# Patient Record
Sex: Male | Born: 1940 | Race: White | Hispanic: No | Marital: Married | Smoking: Former smoker
Health system: Southern US, Community
[De-identification: ages and names within clinical notes are randomized; demographics above are authoritative.]

## PROBLEM LIST (undated history)

## (undated) DIAGNOSIS — M1990 Unspecified osteoarthritis, unspecified site: Secondary | ICD-10-CM

## (undated) DIAGNOSIS — M545 Low back pain, unspecified: Secondary | ICD-10-CM

## (undated) DIAGNOSIS — I10 Essential (primary) hypertension: Secondary | ICD-10-CM

## (undated) DIAGNOSIS — K219 Gastro-esophageal reflux disease without esophagitis: Secondary | ICD-10-CM

## (undated) HISTORY — DX: Gastro-esophageal reflux disease without esophagitis: K21.9

## (undated) HISTORY — PX: HEMORRHOID SURGERY: SHX153

## (undated) HISTORY — DX: Unspecified osteoarthritis, unspecified site: M19.90

## (undated) HISTORY — DX: Low back pain, unspecified: M54.50

## (undated) HISTORY — DX: Essential (primary) hypertension: I10

---

## 2019-09-09 DIAGNOSIS — M79641 Pain in right hand: Secondary | ICD-10-CM | POA: Insufficient documentation

## 2020-03-24 ENCOUNTER — Telehealth: Payer: Self-pay | Admitting: Physical Medicine and Rehabilitation

## 2020-03-24 NOTE — Telephone Encounter (Signed)
Patient called.   He is requesting a call back to set up a new patient appointment for his back. Referred by a friend. [employer] and [employer].   Call back: [phone] or [phone]

## 2020-03-24 NOTE — Telephone Encounter (Signed)
Scheduled for new patient OV on 10/6 at 1000.

## 2020-04-14 ENCOUNTER — Encounter: Payer: Self-pay | Admitting: Physical Medicine and Rehabilitation

## 2020-04-14 ENCOUNTER — Ambulatory Visit: Payer: Medicare PPO | Admitting: Physical Medicine and Rehabilitation

## 2020-04-14 ENCOUNTER — Other Ambulatory Visit: Payer: Self-pay

## 2020-04-14 ENCOUNTER — Ambulatory Visit (INDEPENDENT_AMBULATORY_CARE_PROVIDER_SITE_OTHER): Payer: Medicare PPO

## 2020-04-14 VITALS — BP 145/87 | HR 80

## 2020-04-14 DIAGNOSIS — M545 Low back pain, unspecified: Secondary | ICD-10-CM | POA: Diagnosis not present

## 2020-04-14 DIAGNOSIS — M47816 Spondylosis without myelopathy or radiculopathy, lumbar region: Secondary | ICD-10-CM | POA: Diagnosis not present

## 2020-04-14 DIAGNOSIS — G8929 Other chronic pain: Secondary | ICD-10-CM

## 2020-04-14 NOTE — Progress Notes (Signed)
Pt state lower back pain. Pt state laying down in bed and standing makes the pain worse. Pt state pain meds helps ease the pain.  Numeric Pain Rating Scale and Functional Assessment Average Pain 5 Pain Right Now 2 My pain is constant, dull and aching Pain is worse with: standing Pain improves with: heat/ice and medication   In the last MONTH (on 0-10 scale) has pain interfered with the following?  1. General activity like being  able to carry out your everyday physical activities such as walking, climbing stairs, carrying groceries, or moving a chair?  Rating(5)  2. Relation with others like being able to carry out your usual social activities and roles such as  activities at home, at work and in your community. Rating(5)  3. Enjoyment of life such that you have  been bothered by emotional problems such as feeling anxious, depressed or irritable?  Rating(5)

## 2020-04-22 ENCOUNTER — Other Ambulatory Visit: Payer: Self-pay

## 2020-04-22 ENCOUNTER — Encounter: Payer: Self-pay | Admitting: Physical Medicine and Rehabilitation

## 2020-04-22 ENCOUNTER — Ambulatory Visit: Payer: Medicare PPO | Admitting: Physical Medicine and Rehabilitation

## 2020-04-22 ENCOUNTER — Ambulatory Visit: Payer: Self-pay

## 2020-04-22 VITALS — BP 117/71 | HR 79

## 2020-04-22 DIAGNOSIS — M545 Low back pain, unspecified: Secondary | ICD-10-CM

## 2020-04-22 DIAGNOSIS — G8929 Other chronic pain: Secondary | ICD-10-CM | POA: Diagnosis not present

## 2020-04-22 DIAGNOSIS — M47816 Spondylosis without myelopathy or radiculopathy, lumbar region: Secondary | ICD-10-CM | POA: Diagnosis not present

## 2020-04-22 MED ORDER — BETAMETHASONE SOD PHOS & ACET 6 (3-3) MG/ML IJ SUSP: 12.0000 mg | Freq: Once | INTRAMUSCULAR | Status: DC

## 2020-04-22 MED ORDER — BUPIVACAINE HCL 0.5 % IJ SOLN: 3.0000 mL | Freq: Once | INTRAMUSCULAR | Status: DC

## 2020-04-22 NOTE — Progress Notes (Signed)
Pt state lower back pain. Pt state standing and sitting for a long time makes his pain worse. Pt state he takes pain meds to ease the pain.  Numeric Pain Rating Scale and Functional Assessment Average Pain 4   In the last MONTH (on 0-10 scale) has pain interfered with the following?  1. General activity like being  able to carry out your everyday physical activities such as walking, climbing stairs, carrying groceries, or moving a chair?  Rating(4)   +Driver, -BT, -Dye Allergies.

## 2020-04-23 NOTE — Procedures (Signed)
Lumbar Diagnostic Facet Joint Nerve Block with Fluoroscopic Guidance   Patient: Arthur Moreno      Date of Birth: [birth year] MRN: [id number] [facility]: [facility]      Visit Date: 04/22/2020   Universal Protocol:    Date/Time: 10/15/215:50 AM  Consent Given By: the patient  Position: PRONE  Additional Comments: Vital signs were monitored before and after the procedure. Patient was prepped and draped in the usual sterile fashion. The correct patient, procedure, and site was verified.   Injection Procedure Details:   Procedure diagnoses:  1. Spondylosis without myelopathy or radiculopathy, lumbar region   2. Chronic bilateral low back pain without sciatica      Meds Administered:  Meds ordered this encounter  Medications  . bupivacaine (MARCAINE) 0.5 % (with pres) injection 3 mL  . betamethasone acetate-betamethasone sodium phosphate (CELESTONE) injection 12 mg     Laterality: Bilateral  Location/Site:  L3-L4 L4-L5 L5-S1  Needle size: 22 ga.  Needle type:spinal  Needle Placement: Oblique pedical  Findings:   -Comments: There was excellent flow of contrast along the articular pillars without intravascular flow.  Procedure Details: The fluoroscope beam is vertically oriented in AP and then obliqued 15 to 20 degrees to the ipsilateral side of the desired nerve to achieve the "Scotty dog" appearance.  The skin over the target area of the junction of the superior articulating process and the transverse process (sacral ala if blocking the L5 dorsal rami) was locally anesthetized with a 1 ml volume of 1% Lidocaine without Epinephrine.  The spinal needle was inserted and advanced in a trajectory view down to the target.   After contact with periosteum and negative aspirate for blood and CSF, correct placement without intravascular or epidural spread was confirmed by injecting 0.5 ml. of Isovue-250.  A spot radiograph was obtained of this image.    Next, a 0.5 ml.  volume of the injectate described above was injected. The needle was then redirected to the other facet joint nerves mentioned above if needed.  Prior to the procedure, the patient was given a Pain Diary which was completed for baseline measurements.  After the procedure, the patient rated their pain every 30 minutes and will continue rating at this frequency for a total of 5 hours.  The patient has been asked to complete the Diary and return to [place] by mail, fax or hand delivered as soon as possible.   Additional Comments:  The patient tolerated the procedure well Dressing: 2 x 2 sterile gauze and Band-Aid    Post-procedure details: Patient was observed during the procedure. Post-procedure instructions were reviewed.  Patient left the clinic in stable condition.

## 2020-04-23 NOTE — Progress Notes (Signed)
Arthur Moreno - 79 y.o. male MRN [id number]  Date of birth: 1941/01/01  Office Visit Note: Visit Date: 04/22/2020 [facility]CP: [facility] Referred by: [facility]  Subjective: Chief Complaint  Patient presents with  . Lower Back - Pain   HPI:  Arthur Moreno is a 79 y.o. male who comes in today at the request of Dr. Laurence Spates for planned Bilateral L3-L4, L4-L5 and L5-S1 Lumbar facet/medial branch block with fluoroscopic guidance.  The patient has failed conservative care including home exercise, medications, time and activity modification.  This injection will be diagnostic and hopefully therapeutic.  Please see requesting physician notes for further details and justification.  Exam has shown concordant pain with facet joint loading.  ROS Otherwise per HPI.  Assessment & Plan: Visit Diagnoses:  1. Spondylosis without myelopathy or radiculopathy, lumbar region   2. Chronic bilateral low back pain without sciatica     Plan: No additional findings.   Meds & Orders:  Meds ordered this encounter  Medications  . bupivacaine (MARCAINE) 0.5 % (with pres) injection 3 mL  . betamethasone acetate-betamethasone sodium phosphate (CELESTONE) injection 12 mg    Orders Placed This Encounter  Procedures  . Facet Injection  . XR C-ARM NO REPORT    Follow-up: Return for Review Pain Diary.   Procedures: No procedures performed  Lumbar Diagnostic Facet Joint Nerve Block with Fluoroscopic Guidance   Patient: Arthur Moreno      Date of Birth: 12-29-1940 MRN: [id number] [facility]: [facility]      Visit Date: 04/22/2020   Universal Protocol:    Date/Time: 10/15/215:50 AM  Consent Given By: the patient  Position: PRONE  Additional Comments: Vital signs were monitored before and after the procedure. Patient was prepped and draped in the usual sterile fashion. The correct patient, procedure, and site was verified.   Injection Procedure Details:   Procedure  diagnoses:  1. Spondylosis without myelopathy or radiculopathy, lumbar region   2. Chronic bilateral low back pain without sciatica      Meds Administered:  Meds ordered this encounter  Medications  . bupivacaine (MARCAINE) 0.5 % (with pres) injection 3 mL  . betamethasone acetate-betamethasone sodium phosphate (CELESTONE) injection 12 mg     Laterality: Bilateral  Location/Site:  L3-L4 L4-L5 L5-S1  Needle size: 22 ga.  Needle type:spinal  Needle Placement: Oblique pedical  Findings:   -Comments: There was excellent flow of contrast along the articular pillars without intravascular flow.  Procedure Details: The fluoroscope beam is vertically oriented in AP and then obliqued 15 to 20 degrees to the ipsilateral side of the desired nerve to achieve the "Scotty dog" appearance.  The skin over the target area of the junction of the superior articulating process and the transverse process (sacral ala if blocking the L5 dorsal rami) was locally anesthetized with a 1 ml volume of 1% Lidocaine without Epinephrine.  The spinal needle was inserted and advanced in a trajectory view down to the target.   After contact with periosteum and negative aspirate for blood and CSF, correct placement without intravascular or epidural spread was confirmed by injecting 0.5 ml. of Isovue-250.  A spot radiograph was obtained of this image.    Next, a 0.5 ml. volume of the injectate described above was injected. The needle was then redirected to the other facet joint nerves mentioned above if needed.  Prior to the procedure, the patient was given a Pain Diary which was completed for baseline measurements.  After the  procedure, the patient rated their pain every 30 minutes and will continue rating at this frequency for a total of 5 hours.  The patient has been asked to complete the Diary and return to [place] by mail, fax or hand delivered as soon as possible.   Additional Comments:  The patient tolerated the  procedure well Dressing: 2 x 2 sterile gauze and Band-Aid    Post-procedure details: Patient was observed during the procedure. Post-procedure instructions were reviewed.  Patient left the clinic in stable condition.     Clinical History: No specialty comments available.     Objective:  VS:  HT:    WT:   BMI:     BP:117/71  HR:79bpm  TEMP: ( )  RESP:  Physical Exam Constitutional:      General: He is not in acute distress.    Appearance: Normal appearance. He is not ill-appearing.  HENT:     Head: Normocephalic and atraumatic.     Right Ear: External ear normal.     Left Ear: External ear normal.  Eyes:     Extraocular Movements: Extraocular movements intact.  Cardiovascular:     Rate and Rhythm: Normal rate.     Pulses: Normal pulses.  Abdominal:     General: There is no distension.     Palpations: Abdomen is soft.  Musculoskeletal:        General: No tenderness or signs of injury.     Right lower leg: No edema.     Left lower leg: No edema.     Comments: Patient has good distal strength without clonus.Patient somewhat slow to rise from a seated position to full extension.  There is concordant low back pain with facet loading and lumbar spine extension rotation.  There are no definitive trigger points but the patient is somewhat tender across the lower back and PSIS.  There is no pain with hip rotation.   Skin:    Findings: No erythema or rash.  Neurological:     General: No focal deficit present.     Mental Status: He is alert and oriented to person, place, and time.     Sensory: No sensory deficit.     Motor: No weakness or abnormal muscle tone.     Coordination: Coordination normal.  Psychiatric:        Mood and Affect: Mood normal.        Behavior: Behavior normal.      Imaging: XR C-ARM NO REPORT  Result Date: 04/22/2020 Please see Notes tab for imaging impression.

## 2020-04-26 ENCOUNTER — Telehealth: Payer: Self-pay | Admitting: Physical Medicine and Rehabilitation

## 2020-04-26 NOTE — Telephone Encounter (Signed)
Pt called wanting to check if [facility] received the rest of his paperwork on Friday 04/23/20; pt would like a CB with an update  [phone]

## 2020-04-26 NOTE — Telephone Encounter (Signed)
Called pt bc and sch his inj.

## 2020-05-17 ENCOUNTER — Ambulatory Visit: Payer: Medicare PPO | Admitting: Physical Medicine and Rehabilitation

## 2020-05-17 ENCOUNTER — Other Ambulatory Visit: Payer: Self-pay

## 2020-05-17 ENCOUNTER — Ambulatory Visit: Payer: Self-pay

## 2020-05-17 ENCOUNTER — Encounter: Payer: Self-pay | Admitting: Physical Medicine and Rehabilitation

## 2020-05-17 VITALS — BP 125/79 | HR 74

## 2020-05-17 DIAGNOSIS — G8929 Other chronic pain: Secondary | ICD-10-CM

## 2020-05-17 DIAGNOSIS — M47816 Spondylosis without myelopathy or radiculopathy, lumbar region: Secondary | ICD-10-CM

## 2020-05-17 DIAGNOSIS — M545 Low back pain, unspecified: Secondary | ICD-10-CM

## 2020-05-17 MED ORDER — BUPIVACAINE HCL 0.5 % IJ SOLN
3.0000 mL | Freq: Once | INTRAMUSCULAR | Status: AC
  Administered 2020-05-17: 3 mL

## 2020-05-17 NOTE — Progress Notes (Signed)
Arthur Moreno - 79 y.o. male MRN [id number]  Date of birth: 1941/07/01  Office Visit Note: Visit Date: 05/17/2020 [facility]: [facility] Referred by: [facility]  Subjective: Chief Complaint  Patient presents with  . Lower Back - Pain   HPI:  Arthur Moreno is a 79 y.o. male who comes in today for planned repeat Bilateral L3-L4, L4-L5 and L5-S1 Lumbar facet/medial branch block with fluoroscopic guidance.  The patient has failed conservative care including home exercise, medications, time and activity modification.  This injection will be diagnostic and hopefully therapeutic.  Please see requesting physician notes for further details and justification.  Exam shows concordant low back pain with facet joint loading and extension. Patient received more than 80% pain relief from prior injection. This would be the second block in a diagnostic double block paradigm.     Referring:Dr. Laurence Spates    ROS Otherwise per HPI.  Assessment & Plan: Visit Diagnoses:  1. Spondylosis without myelopathy or radiculopathy, lumbar region   2. Chronic bilateral low back pain without sciatica     Plan: No additional findings.   Meds & Orders:  Meds ordered this encounter  Medications  . bupivacaine (MARCAINE) 0.5 % (with pres) injection 3 mL    Orders Placed This Encounter  Procedures  . Facet Injection  . XR C-ARM NO REPORT    Follow-up: Return for Review Pain Diary.   Procedures: No procedures performed  Lumbar Diagnostic Facet Joint Nerve Block with Fluoroscopic Guidance   Patient: Arthur Moreno      Date of Birth: 02/02/1941 MRN: [id number] [facility]: [facility]      Visit Date: 05/17/2020   Universal Protocol:    Date/Time: 11/08/212:10 PM  Consent Given By: the patient  Position: PRONE  Additional Comments: Vital signs were monitored before and after the procedure. Patient was prepped and draped in the usual sterile fashion. The correct patient,  procedure, and site was verified.   Injection Procedure Details:   Procedure diagnoses:  1. Spondylosis without myelopathy or radiculopathy, lumbar region   2. Chronic bilateral low back pain without sciatica      Meds Administered:  Meds ordered this encounter  Medications  . bupivacaine (MARCAINE) 0.5 % (with pres) injection 3 mL     Laterality: Bilateral  Location/Site:  L3-L4 L4-L5 L5-S1  Needle:5.0 in., 25 ga.  Short bevel or Quincke spinal needle  Needle Placement: Oblique pedical  Findings:   -Comments: There was excellent flow of contrast along the articular pillars without intravascular flow.  Procedure Details: The fluoroscope beam is vertically oriented in AP and then obliqued 15 to 20 degrees to the ipsilateral side of the desired nerve to achieve the "Scotty dog" appearance.  The skin over the target area of the junction of the superior articulating process and the transverse process (sacral ala if blocking the L5 dorsal rami) was locally anesthetized with a 1 ml volume of 1% Lidocaine without Epinephrine.  The spinal needle was inserted and advanced in a trajectory view down to the target.   After contact with periosteum and negative aspirate for blood and CSF, correct placement without intravascular or epidural spread was confirmed by injecting 0.5 ml. of Isovue-250.  A spot radiograph was obtained of this image.    Next, a 0.5 ml. volume of the injectate described above was injected. The needle was then redirected to the other facet joint nerves mentioned above if needed.  Prior to the procedure, the patient was given  a Pain Diary which was completed for baseline measurements.  After the procedure, the patient rated their pain every 30 minutes and will continue rating at this frequency for a total of 5 hours.  The patient has been asked to complete the Diary and return to [place] by mail, fax or hand delivered as soon as possible.   Additional Comments:  The  patient tolerated the procedure well Dressing: 2 x 2 sterile gauze and Band-Aid    Post-procedure details: Patient was observed during the procedure. Post-procedure instructions were reviewed.  Patient left the clinic in stable condition.    Clinical History: No specialty comments available.     Objective:  VS:  HT:    WT:   BMI:     BP:125/79  HR:74bpm  TEMP: ( )  RESP:  Physical Exam Constitutional:      General: He is not in acute distress.    Appearance: Normal appearance. He is not ill-appearing.  HENT:     Head: Normocephalic and atraumatic.     Right Ear: External ear normal.     Left Ear: External ear normal.  Eyes:     Extraocular Movements: Extraocular movements intact.  Cardiovascular:     Rate and Rhythm: Normal rate.     Pulses: Normal pulses.  Abdominal:     General: There is no distension.     Palpations: Abdomen is soft.  Musculoskeletal:        General: No tenderness or signs of injury.     Right lower leg: No edema.     Left lower leg: No edema.     Comments: Patient has good distal strength without clonus. Patient somewhat slow to rise from a seated position to full extension.  There is concordant low back pain with facet loading and lumbar spine extension rotation.  There are no definitive trigger points but the patient is somewhat tender across the lower back and PSIS.  There is no pain with hip rotation.   Skin:    Findings: No erythema or rash.  Neurological:     General: No focal deficit present.     Mental Status: He is alert and oriented to person, place, and time.     Sensory: No sensory deficit.     Motor: No weakness or abnormal muscle tone.     Coordination: Coordination normal.  Psychiatric:        Mood and Affect: Mood normal.        Behavior: Behavior normal.      Imaging: XR C-ARM NO REPORT  Result Date: 05/17/2020 Please see Notes tab for imaging impression.

## 2020-05-17 NOTE — Procedures (Signed)
Lumbar Diagnostic Facet Joint Nerve Block with Fluoroscopic Guidance   Patient: Arthur Moreno      Date of Birth: [birth year] MRN: [id number] [facility]CP: [facility]      Visit Date: 05/17/2020   Universal Protocol:    Date/Time: 11/08/212:10 PM  Consent Given By: the patient  Position: PRONE  Additional Comments: Vital signs were monitored before and after the procedure. Patient was prepped and draped in the usual sterile fashion. The correct patient, procedure, and site was verified.   Injection Procedure Details:   Procedure diagnoses:  1. Spondylosis without myelopathy or radiculopathy, lumbar region   2. Chronic bilateral low back pain without sciatica      Meds Administered:  Meds ordered this encounter  Medications  . bupivacaine (MARCAINE) 0.5 % (with pres) injection 3 mL     Laterality: Bilateral  Location/Site:  L3-L4 L4-L5 L5-S1  Needle:5.0 in., 25 ga.  Short bevel or Quincke spinal needle  Needle Placement: Oblique pedical  Findings:   -Comments: There was excellent flow of contrast along the articular pillars without intravascular flow.  Procedure Details: The fluoroscope beam is vertically oriented in AP and then obliqued 15 to 20 degrees to the ipsilateral side of the desired nerve to achieve the "Scotty dog" appearance.  The skin over the target area of the junction of the superior articulating process and the transverse process (sacral ala if blocking the L5 dorsal rami) was locally anesthetized with a 1 ml volume of 1% Lidocaine without Epinephrine.  The spinal needle was inserted and advanced in a trajectory view down to the target.   After contact with periosteum and negative aspirate for blood and CSF, correct placement without intravascular or epidural spread was confirmed by injecting 0.5 ml. of Isovue-250.  A spot radiograph was obtained of this image.    Next, a 0.5 ml. volume of the injectate described above was injected. The needle was  then redirected to the other facet joint nerves mentioned above if needed.  Prior to the procedure, the patient was given a Pain Diary which was completed for baseline measurements.  After the procedure, the patient rated their pain every 30 minutes and will continue rating at this frequency for a total of 5 hours.  The patient has been asked to complete the Diary and return to [place] by mail, fax or hand delivered as soon as possible.   Additional Comments:  The patient tolerated the procedure well Dressing: 2 x 2 sterile gauze and Band-Aid    Post-procedure details: Patient was observed during the procedure. Post-procedure instructions were reviewed.  Patient left the clinic in stable condition.

## 2020-05-17 NOTE — Progress Notes (Signed)
Pt state lower back pain. Pt state standing for a long period of time makes the pain worse. Pt state he has to sit down to ease the pain. Pt has hx of inj on 04/22/20 pt state that the inj was great and it helped. Pt state that the pain starts to return after four days.  Numeric Pain Rating Scale and Functional Assessment Average Pain 4   In the last MONTH (on 0-10 scale) has pain interfered with the following?  1. General activity like being  able to carry out your everyday physical activities such as walking, climbing stairs, carrying groceries, or moving a chair?  Rating(5)   +Driver, -BT, -Dye Allergies.

## 2020-05-18 ENCOUNTER — Telehealth: Payer: Self-pay | Admitting: Physical Medicine and Rehabilitation

## 2020-05-18 NOTE — Telephone Encounter (Signed)
Received pain dairy and pt insurance is PENDING.

## 2020-05-18 NOTE — Telephone Encounter (Signed)
Pt called asking for a CB from [facility] in regards to forms that were faxed to her and he would like to set an appt as well  [phone]

## 2020-05-20 NOTE — Telephone Encounter (Signed)
Called pt and sch RFA 06/29/20 and 07/13/20.

## 2020-06-06 ENCOUNTER — Encounter: Payer: Self-pay | Admitting: Physical Medicine and Rehabilitation

## 2020-06-06 NOTE — Progress Notes (Signed)
Arthur Moreno - 79 y.o. male MRN [id number]  Date of birth: 03-08-1941  Office Visit Note: Visit Date: 04/14/2020 PCP: [facility] Referred by: No ref. provider found  Subjective: Chief Complaint  Patient presents with  . Lower Back - Pain   HPI: Arthur Moreno is a 79 y.o. male who comes in today As a self-referral for chronic worsening axial low back pain that is been severe over the last 6 months.  He really reports pain over the last many years and he has had chiropractic care he has had physical therapy and he does complete home exercise still.  Home exercise routine is generally stretching and core strengthening that he learned.  He reports a constant dull and aching pain worse with standing.  He reports no radicular pain into the thighs or buttock.  No neurogenic claudication symptoms.  Medication heat and ice tends to help to some degree but not much.  He does use anti-inflammatories as well as Tylenol and topical medications.  He reports laying down in bed and trying to get up and rotate in bed really is difficult.  He does use ibuprofen mostly for pain relief but he cannot be on that for long periods because of hypertension.  He has not had any opioid medication.  He has had muscle relaxers without any real results.  He has never had any spine surgery or spine procedures.  I did obtain x-rays today of the lumbar spine.  This is reviewed with him today using spine models and imaging.  The dictated report is below.  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  He has no red flag symptoms of focal weakness or radicular pain.  Chronic ongoing recalcitrant back pain.  He reports 5 out of 10 back pain that does limit his daily activities.  He was referred by a friend who has had radiofrequency ablation done by me and did seem to help with her back pain.  Review of Systems  Musculoskeletal: Positive for back pain.  All other systems reviewed and are  negative.  Otherwise per HPI.  Assessment & Plan: Visit Diagnoses:  1. Chronic bilateral low back pain without sciatica   2. Spondylosis without myelopathy or radiculopathy, lumbar region     Plan: Findings:  Chronic worsening severe axial low back pain worse with standing worse with facet loading on exam with no clinical findings of radiculopathy or stenosis or claudication.  Imaging does show facet arthropathy.  Patient has had physical therapy and chiropractic care and continues on home exercises without much relief.  He has been using pain medications mainly Tylenol and ibuprofen with only temporary relief.  Neck step is diagnostic medial branch blocks of the lower lumbar facet joints using fluoroscopic guidance.  We will use a pain diary.  If he gets more than 50% relief would look at double block paradigm and potential for radiofrequency ablation.    Meds & Orders: No orders of the defined types were placed in this encounter.   Orders Placed This Encounter  Procedures  . XR Lumbar Spine 2-3 Views    Follow-up: Return for Bilateral medial branch blocks as noted.   Procedures: No procedures performed  No notes on file   Clinical History: No specialty comments available.   He has no history on file for tobacco use. No results for input(s): HGBA1C, LABURIC in the last 8760 hours.  Objective:  VS:  HT:  WT:   BMI:     BP:(!) 145/87  HR:80bpm  TEMP: ( )  RESP:  Physical Exam Constitutional:      General: He is not in acute distress.    Appearance: Normal appearance. He is not ill-appearing.  HENT:     Head: Normocephalic and atraumatic.     Right Ear: External ear normal.     Left Ear: External ear normal.  Eyes:     Extraocular Movements: Extraocular movements intact.  Cardiovascular:     Rate and Rhythm: Normal rate.     Pulses: Normal pulses.  Pulmonary:     Effort: Pulmonary effort is normal. No respiratory distress.  Abdominal:     General: There is no  distension.     Palpations: Abdomen is soft.  Musculoskeletal:        General: No tenderness or signs of injury.     Right lower leg: No edema.     Left lower leg: No edema.     Comments: Patient has good distal strength without clonus. Patient somewhat slow to rise from a seated position to full extension.  There is concordant low back pain with facet loading and lumbar spine extension rotation.  There are no definitive trigger points but the patient is somewhat tender across the lower back and PSIS.  There is no pain with hip rotation.  Skin:    Findings: No erythema or rash.  Neurological:     General: No focal deficit present.     Mental Status: He is alert and oriented to person, place, and time.     Sensory: No sensory deficit.     Motor: No weakness or abnormal muscle tone.     Coordination: Coordination normal.  Psychiatric:        Mood and Affect: Mood normal.        Behavior: Behavior normal.     Ortho Exam  Imaging: AP and lateral lumbar spine dated today: Patient has fairly normal anatomic alignment of the lumbar spine with good lumbar lordosis with only very minimal listhesis of L2 on L3.  He has facet arthropathy at L5-S1 in particular with by foraminal narrowing.  Some arthropathy at L4-5.  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  No fractures or dislocations.  Past Medical/Family/Surgical/Social History: Medications & Allergies reviewed per EMR, new medications updated. Patient Active Problem List   Diagnosis Date Noted  . Pain in right hand 09/09/2019   Past Medical History:  Diagnosis Date  . Acid reflux   . Hypertension   . Low back pain    No family history on file. Past Surgical History:  Procedure Laterality Date  . HEMORRHOID SURGERY     At the age of 2yrs   Social History   Occupational History  . Not on file  Tobacco Use  . Smoking status: Not on file  Substance and Sexual Activity  . Alcohol use: Not on file   . Drug use: Not on file  . Sexual activity: Not on file

## 2020-06-29 ENCOUNTER — Encounter: Payer: Self-pay | Admitting: Physical Medicine and Rehabilitation

## 2020-06-29 ENCOUNTER — Ambulatory Visit: Payer: Medicare PPO | Admitting: Physical Medicine and Rehabilitation

## 2020-06-29 ENCOUNTER — Ambulatory Visit: Payer: Self-pay

## 2020-06-29 ENCOUNTER — Other Ambulatory Visit: Payer: Self-pay

## 2020-06-29 VITALS — BP 113/77 | HR 80

## 2020-06-29 DIAGNOSIS — M47816 Spondylosis without myelopathy or radiculopathy, lumbar region: Secondary | ICD-10-CM

## 2020-06-29 MED ORDER — METHYLPREDNISOLONE ACETATE 80 MG/ML IJ SUSP
80.0000 mg | Freq: Once | INTRAMUSCULAR | Status: AC
  Administered 2020-06-29: 16:00:00 80 mg

## 2020-06-29 NOTE — Progress Notes (Signed)
Pt state lower back pain. Pt state standing makes the pain worse. Pt state pain meds and sitting down helps ease the pain. Pt has hx of inj on 05/17/20 pt state it worked well but lasted for four days.  Numeric Pain Rating Scale and Functional Assessment Average Pain 5   In the last MONTH (on 0-10 scale) has pain interfered with the following?  1. General activity like being  able to carry out your everyday physical activities such as walking, climbing stairs, carrying groceries, or moving a chair?  Rating(7)   +Driver, -BT, -Dye Allergies.

## 2020-06-30 ENCOUNTER — Encounter: Payer: Self-pay | Admitting: Physical Medicine and Rehabilitation

## 2020-06-30 NOTE — Procedures (Signed)
Lumbar Facet Joint Nerve Denervation  Patient: Arthur Moreno      Date of Birth: 09-Jan-1941 MRN: [id number] [facility]: [facility]      Visit Date: 06/29/2020   Universal Protocol:    Date/Time: 12/22/216:14 AM  Consent Given By: the patient  Position: PRONE  Additional Comments: Vital signs were monitored before and after the procedure. Patient was prepped and draped in the usual sterile fashion. The correct patient, procedure, and site was verified.   Injection Procedure Details:   Procedure diagnoses:  1. Spondylosis without myelopathy or radiculopathy, lumbar region      Meds Administered:  Meds ordered this encounter  Medications  . methylPREDNISolone acetate (DEPO-MEDROL) injection 80 mg     Laterality: Right  Location/Site:  L3-L4, L2 and L3 medial branches, L4-L5, L3 and L4 medial branches and L5-S1, L4 medial branch and L5 dorsal ramus  Needle: 18 ga.,  11mm active tip RF Cannula  Needle Placement: Along juncture of superior articular process and transverse pocess  Findings:  -Comments:  Procedure Details: For each desired target nerve, the corresponding transverse process (sacral ala for the L5 dorsal rami) was identified and the fluoroscope was positioned to square off the endplates of the corresponding vertebral body to achieve a true AP midline view.  The beam was then obliqued 15 to 20 degrees and caudally tilted 15 to 20 degrees to line up a trajectory along the target nerves. The skin over the target of the junction of superior articulating process and transverse process (sacral ala for the L5 dorsal rami) was infiltrated with 25ml of 1% Lidocaine without Epinephrine.  The 18 gauge 69mm active tip outer cannula was advanced in trajectory view to the target.  This procedure was repeated for each target nerve.  Then, for all levels, the outer cannula placement was fine-tuned and the position was then confirmed with bi-planar imaging.    Test  stimulation was done both at sensory and motor levels to ensure there was no radicular stimulation. The target tissues were then infiltrated with 1 ml of 1% Lidocaine without Epinephrine. Subsequently, a percutaneous neurotomy was carried out for 90 seconds at 80 degrees Celsius.  After the completion of the lesion, 1 ml of injectate was delivered. It was then repeated for each facet joint nerve mentioned above. Appropriate radiographs were obtained to verify the probe placement during the neurotomy.   Additional Comments:  The patient tolerated the procedure well Dressing: 2 x 2 sterile gauze and Band-Aid    Post-procedure details: Patient was observed during the procedure. Post-procedure instructions were reviewed.  Patient left the clinic in stable condition.

## 2020-06-30 NOTE — Progress Notes (Signed)
Arthur Moreno - 79 y.o. male MRN [id number]  Date of birth: 05-11-1941  Office Visit Note: Visit Date: 06/29/2020 [facility]: [facility] Referred by: [facility]  Subjective: Chief Complaint  Patient presents with  . Lower Back - Pain   HPI:  [name] is a 79 y.o. male who comes in today for planned radiofrequency ablation of the Right L3-L4, L4-L5 and L5-S1 Lumbar facet joints. This would be ablation of the corresponding medial branches and/or dorsal rami.  Patient has had double diagnostic blocks with more than 50% relief.  These are documented on pain diary.  They have had chronic back pain for quite some time, more than 3 months, which has been an ongoing situation with recalcitrant axial back pain.  They have no radicular pain.  Their axial pain is worse with standing and ambulating and on exam today with facet loading.  They have had physical therapy as well as home exercise program.  The imaging noted in the chart below indicated facet pathology. Accordingly they meet all the criteria and qualification for for radiofrequency ablation and we are going to complete this today hopefully for more longer term relief as part of comprehensive management program.  ROS Otherwise per HPI.  Assessment & Plan: Visit Diagnoses:    ICD-10-CM   1. Spondylosis without myelopathy or radiculopathy, lumbar region  M47.816 XR C-ARM NO REPORT    Radiofrequency,Lumbar    methylPREDNISolone acetate (DEPO-MEDROL) injection 80 mg    Plan: No additional findings.   Meds & Orders:  Meds ordered this encounter  Medications  . methylPREDNISolone acetate (DEPO-MEDROL) injection 80 mg    Orders Placed This Encounter  Procedures  . Radiofrequency,Lumbar  . XR C-ARM NO REPORT    Follow-up: Return if symptoms worsen or fail to improve.   Procedures: No procedures performed  Lumbar Facet Joint Nerve Denervation  Patient: Arthur Moreno      Date of Birth: 06/30/1941 MRN:  [id number] [facility]: [facility]      Visit Date: 06/29/2020   Universal Protocol:    Date/Time: 12/22/216:14 AM  Consent Given By: the patient  Position: PRONE  Additional Comments: Vital signs were monitored before and after the procedure. Patient was prepped and draped in the usual sterile fashion. The correct patient, procedure, and site was verified.   Injection Procedure Details:   Procedure diagnoses:  1. Spondylosis without myelopathy or radiculopathy, lumbar region      Meds Administered:  Meds ordered this encounter  Medications  . methylPREDNISolone acetate (DEPO-MEDROL) injection 80 mg     Laterality: Right  Location/Site:  L3-L4, L2 and L3 medial branches, L4-L5, L3 and L4 medial branches and L5-S1, L4 medial branch and L5 dorsal ramus  Needle: 18 ga.,  25mm active tip RF Cannula  Needle Placement: Along juncture of superior articular process and transverse pocess  Findings:  -Comments:  Procedure Details: For each desired target nerve, the corresponding transverse process (sacral ala for the L5 dorsal rami) was identified and the fluoroscope was positioned to square off the endplates of the corresponding vertebral body to achieve a true AP midline view.  The beam was then obliqued 15 to 20 degrees and caudally tilted 15 to 20 degrees to line up a trajectory along the target nerves. The skin over the target of the junction of superior articulating process and transverse process (sacral ala for the L5 dorsal rami) was infiltrated with 34ml of 1% Lidocaine without Epinephrine.  The 18 gauge 40mm active  tip outer cannula was advanced in trajectory view to the target.  This procedure was repeated for each target nerve.  Then, for all levels, the outer cannula placement was fine-tuned and the position was then confirmed with bi-planar imaging.    Test stimulation was done both at sensory and motor levels to ensure there was no radicular stimulation. The  target tissues were then infiltrated with 1 ml of 1% Lidocaine without Epinephrine. Subsequently, a percutaneous neurotomy was carried out for 90 seconds at 80 degrees Celsius.  After the completion of the lesion, 1 ml of injectate was delivered. It was then repeated for each facet joint nerve mentioned above. Appropriate radiographs were obtained to verify the probe placement during the neurotomy.   Additional Comments:  The patient tolerated the procedure well Dressing: 2 x 2 sterile gauze and Band-Aid    Post-procedure details: Patient was observed during the procedure. Post-procedure instructions were reviewed.  Patient left the clinic in stable condition.       Clinical History: No specialty comments available.     Objective:  VS:  HT:    WT:   BMI:     BP:113/77  HR:80bpm  TEMP: ( )  RESP:  Physical Exam Constitutional:      General: He is not in acute distress.    Appearance: Normal appearance. He is not ill-appearing.  HENT:     Head: Normocephalic and atraumatic.     Right Ear: External ear normal.     Left Ear: External ear normal.  Eyes:     Extraocular Movements: Extraocular movements intact.  Cardiovascular:     Rate and Rhythm: Normal rate.     Pulses: Normal pulses.  Abdominal:     General: There is no distension.     Palpations: Abdomen is soft.  Musculoskeletal:        General: No tenderness or signs of injury.     Right lower leg: No edema.     Left lower leg: No edema.     Comments: Patient has good distal strength without clonus. Patient somewhat slow to rise from a seated position to full extension.  There is concordant low back pain with facet loading and lumbar spine extension rotation.  There are no definitive trigger points but the patient is somewhat tender across the lower back and PSIS.  There is no pain with hip rotation.   Skin:    Findings: No erythema or rash.  Neurological:     General: No focal deficit present.     Mental  Status: He is alert and oriented to person, place, and time.     Sensory: No sensory deficit.     Motor: No weakness or abnormal muscle tone.     Coordination: Coordination normal.  Psychiatric:        Mood and Affect: Mood normal.        Behavior: Behavior normal.      Imaging: XR C-ARM NO REPORT  Result Date: 06/29/2020 Please see Notes tab for imaging impression.

## 2020-07-13 ENCOUNTER — Other Ambulatory Visit: Payer: Self-pay

## 2020-07-13 ENCOUNTER — Encounter: Payer: Self-pay | Admitting: Physical Medicine and Rehabilitation

## 2020-07-13 ENCOUNTER — Ambulatory Visit: Payer: Medicare PPO | Admitting: Physical Medicine and Rehabilitation

## 2020-07-13 ENCOUNTER — Ambulatory Visit: Payer: Self-pay

## 2020-07-13 VITALS — BP 128/78 | HR 84

## 2020-07-13 DIAGNOSIS — G8929 Other chronic pain: Secondary | ICD-10-CM

## 2020-07-13 DIAGNOSIS — M47816 Spondylosis without myelopathy or radiculopathy, lumbar region: Secondary | ICD-10-CM | POA: Diagnosis not present

## 2020-07-13 DIAGNOSIS — M545 Low back pain, unspecified: Secondary | ICD-10-CM | POA: Diagnosis not present

## 2020-07-13 MED ORDER — BETAMETHASONE SOD PHOS & ACET 6 (3-3) MG/ML IJ SUSP
12.0000 mg | Freq: Once | INTRAMUSCULAR | Status: AC
  Administered 2020-07-13: 12 mg

## 2020-07-13 NOTE — Progress Notes (Signed)
Pt state lower back pain , mostly on his right side. Pt states standing for a long period of time makes the pain. Pt state he would sit down and takes pain meds to help ease the pain. Pt has hx of inj on 06/29/20 Pt state it helped.    Numeric Pain Rating Scale and Functional Assessment Average Pain 8   In the last MONTH (on 0-10 scale) has pain interfered with the following?  1. General activity like being  able to carry out your everyday physical activities such as walking, climbing stairs, carrying groceries, or moving a chair?  Rating(8)   +Driver, -BT, -Dye Allergies.

## 2020-07-13 NOTE — Progress Notes (Signed)
Arthur Moreno - 80 y.o. male MRN [id number]  Date of birth: June 29, 1941  Office Visit Note: Visit Date: 07/13/2020 [facility]: [facility] Referred by: [facility]  Subjective: Chief Complaint  Patient presents with  . Lower Back - Pain   HPI:  Arthur Moreno is a 80 y.o. male who comes in today for planned radiofrequency ablation of the Left L3-L4, L4-L5 and L5-S1 Lumbar facet joints. This would be ablation of the corresponding medial branches and/or dorsal rami.  Patient has had double diagnostic blocks with more than 50% relief.  These are documented on pain diary.  They have had chronic back pain for quite some time, more than 3 months, which has been an ongoing situation with recalcitrant axial back pain.  They have no radicular pain.  Their axial pain is worse with standing and ambulating and on exam today with facet loading.  They have had physical therapy as well as home exercise program.  The imaging noted in the chart below indicated facet pathology. Accordingly they meet all the criteria and qualification for for radiofrequency ablation and we are going to complete this today hopefully for more longer term relief as part of comprehensive management program.  ROS Otherwise per HPI.  Assessment & Plan: Visit Diagnoses:    ICD-10-CM   1. Spondylosis without myelopathy or radiculopathy, lumbar region  M47.816 XR C-ARM NO REPORT    Radiofrequency,Lumbar    betamethasone acetate-betamethasone sodium phosphate (CELESTONE) injection 12 mg  2. Chronic bilateral low back pain without sciatica  M54.50 XR C-ARM NO REPORT   G89.29 Radiofrequency,Lumbar    betamethasone acetate-betamethasone sodium phosphate (CELESTONE) injection 12 mg    Plan: No additional findings.   Meds & Orders:  Meds ordered this encounter  Medications  . betamethasone acetate-betamethasone sodium phosphate (CELESTONE) injection 12 mg    Orders Placed This Encounter  Procedures  .  Radiofrequency,Lumbar  . XR C-ARM NO REPORT    Follow-up: Return in about 4 weeks (around 08/10/2020).   Procedures: No procedures performed  Lumbar Facet Joint Nerve Denervation  Patient: Arthur Moreno      Date of Birth: 1941-06-04 MRN: [id number] [facility]: [facility]      Visit Date: 07/13/2020   Universal Protocol:    Date/Time: 01/04/223:47 PM  Consent Given By: the patient  Position: PRONE  Additional Comments: Vital signs were monitored before and after the procedure. Patient was prepped and draped in the usual sterile fashion. The correct patient, procedure, and site was verified.   Injection Procedure Details:   Procedure diagnoses:  1. Spondylosis without myelopathy or radiculopathy, lumbar region   2. Chronic bilateral low back pain without sciatica      Meds Administered:  Meds ordered this encounter  Medications  . betamethasone acetate-betamethasone sodium phosphate (CELESTONE) injection 12 mg     Laterality: Left  Location/Site:  L3-L4, L2 and L3 medial branches, L4-L5, L3 and L4 medial branches and L5-S1, L4 medial branch and L5 dorsal ramus  Needle: 18 ga.,  20mm active tip RF Cannula  Needle Placement: Along juncture of superior articular process and transverse pocess  Findings:  -Comments:  Procedure Details: For each desired target nerve, the corresponding transverse process (sacral ala for the L5 dorsal rami) was identified and the fluoroscope was positioned to square off the endplates of the corresponding vertebral body to achieve a true AP midline view.  The beam was then obliqued 15 to 20 degrees and caudally tilted 15 to 20 degrees to  line up a trajectory along the target nerves. The skin over the target of the junction of superior articulating process and transverse process (sacral ala for the L5 dorsal rami) was infiltrated with 27ml of 1% Lidocaine without Epinephrine.  The 18 gauge 39mm active tip outer cannula was advanced in  trajectory view to the target.  This procedure was repeated for each target nerve.  Then, for all levels, the outer cannula placement was fine-tuned and the position was then confirmed with bi-planar imaging.    Test stimulation was done both at sensory and motor levels to ensure there was no radicular stimulation. The target tissues were then infiltrated with 1 ml of 1% Lidocaine without Epinephrine. Subsequently, a percutaneous neurotomy was carried out for 90 seconds at 80 degrees Celsius.  After the completion of the lesion, 1 ml of injectate was delivered. It was then repeated for each facet joint nerve mentioned above. Appropriate radiographs were obtained to verify the probe placement during the neurotomy.   Additional Comments:  The patient tolerated the procedure well Dressing: 2 x 2 sterile gauze and Band-Aid    Post-procedure details: Patient was observed during the procedure. Post-procedure instructions were reviewed.  Patient left the clinic in stable condition.        Clinical History: No specialty comments available.     Objective:  VS:  HT:    WT:   BMI:     BP:128/78  HR:84bpm  TEMP: ( )  RESP:  Physical Exam Vitals and nursing note reviewed.  Constitutional:      General: He is not in acute distress.    Appearance: Normal appearance. He is not ill-appearing.  HENT:     Head: Normocephalic and atraumatic.     Right Ear: External ear normal.     Left Ear: External ear normal.     Nose: No congestion.  Eyes:     Extraocular Movements: Extraocular movements intact.  Cardiovascular:     Rate and Rhythm: Normal rate.     Pulses: Normal pulses.  Pulmonary:     Effort: Pulmonary effort is normal. No respiratory distress.  Abdominal:     General: There is no distension.     Palpations: Abdomen is soft.  Musculoskeletal:        General: No tenderness or signs of injury.     Cervical back: Neck supple.     Right lower leg: No edema.     Left lower  leg: No edema.     Comments: Patient has good distal strength without clonus. Patient somewhat slow to rise from a seated position to full extension.  There is concordant low back pain with facet loading and lumbar spine extension rotation.  There are no definitive trigger points but the patient is somewhat tender across the lower back and PSIS.  There is no pain with hip rotation.   Skin:    Findings: No erythema or rash.  Neurological:     General: No focal deficit present.     Mental Status: He is alert and oriented to person, place, and time.     Sensory: No sensory deficit.     Motor: No weakness or abnormal muscle tone.     Coordination: Coordination normal.  Psychiatric:        Mood and Affect: Mood normal.        Behavior: Behavior normal.      Imaging: No results found.

## 2020-07-13 NOTE — Procedures (Signed)
Lumbar Facet Joint Nerve Denervation  Patient: Arthur Moreno      Date of Birth: [birth year] MRN: [id number] PCP: [facility]      Visit Date: 07/13/2020   Universal Protocol:    Date/Time: 01/04/223:47 PM  Consent Given By: the patient  Position: PRONE  Additional Comments: Vital signs were monitored before and after the procedure. Patient was prepped and draped in the usual sterile fashion. The correct patient, procedure, and site was verified.   Injection Procedure Details:   Procedure diagnoses:  1. Spondylosis without myelopathy or radiculopathy, lumbar region   2. Chronic bilateral low back pain without sciatica      Meds Administered:  Meds ordered this encounter  Medications  . betamethasone acetate-betamethasone sodium phosphate (CELESTONE) injection 12 mg     Laterality: Left  Location/Site:  L3-L4, L2 and L3 medial branches, L4-L5, L3 and L4 medial branches and L5-S1, L4 medial branch and L5 dorsal ramus  Needle: 18 ga.,  47mm active tip RF Cannula  Needle Placement: Along juncture of superior articular process and transverse pocess  Findings:  -Comments:  Procedure Details: For each desired target nerve, the corresponding transverse process (sacral ala for the L5 dorsal rami) was identified and the fluoroscope was positioned to square off the endplates of the corresponding vertebral body to achieve a true AP midline view.  The beam was then obliqued 15 to 20 degrees and caudally tilted 15 to 20 degrees to line up a trajectory along the target nerves. The skin over the target of the junction of superior articulating process and transverse process (sacral ala for the L5 dorsal rami) was infiltrated with 80ml of 1% Lidocaine without Epinephrine.  The 18 gauge 40mm active tip outer cannula was advanced in trajectory view to the target.  This procedure was repeated for each target nerve.  Then, for all levels, the outer cannula placement was fine-tuned  and the position was then confirmed with bi-planar imaging.    Test stimulation was done both at sensory and motor levels to ensure there was no radicular stimulation. The target tissues were then infiltrated with 1 ml of 1% Lidocaine without Epinephrine. Subsequently, a percutaneous neurotomy was carried out for 90 seconds at 80 degrees Celsius.  After the completion of the lesion, 1 ml of injectate was delivered. It was then repeated for each facet joint nerve mentioned above. Appropriate radiographs were obtained to verify the probe placement during the neurotomy.   Additional Comments:  The patient tolerated the procedure well Dressing: 2 x 2 sterile gauze and Band-Aid    Post-procedure details: Patient was observed during the procedure. Post-procedure instructions were reviewed.  Patient left the clinic in stable condition.

## 2020-08-10 ENCOUNTER — Ambulatory Visit: Payer: Medicare PPO | Admitting: Physical Medicine and Rehabilitation

## 2020-10-30 ENCOUNTER — Other Ambulatory Visit: Payer: Self-pay

## 2020-10-30 ENCOUNTER — Emergency Department (HOSPITAL_COMMUNITY): Payer: Medicare PPO

## 2020-10-30 ENCOUNTER — Encounter (HOSPITAL_COMMUNITY): Payer: Self-pay | Admitting: Emergency Medicine

## 2020-10-30 ENCOUNTER — Observation Stay (HOSPITAL_COMMUNITY)
Admission: EM | Admit: 2020-10-30 | Discharge: 2020-10-31 | Disposition: A | Discharge status: Home / Self Care | Payer: Medicare PPO | Attending: General Surgery | Admitting: General Surgery

## 2020-10-30 DIAGNOSIS — M1990 Unspecified osteoarthritis, unspecified site: Secondary | ICD-10-CM

## 2020-10-30 DIAGNOSIS — R1031 Right lower quadrant pain: Secondary | ICD-10-CM | POA: Diagnosis present

## 2020-10-30 DIAGNOSIS — R109 Unspecified abdominal pain: Secondary | ICD-10-CM

## 2020-10-30 DIAGNOSIS — E782 Mixed hyperlipidemia: Secondary | ICD-10-CM

## 2020-10-30 DIAGNOSIS — K3580 Unspecified acute appendicitis: Secondary | ICD-10-CM | POA: Diagnosis present

## 2020-10-30 DIAGNOSIS — Z20822 Contact with and (suspected) exposure to covid-19: Secondary | ICD-10-CM | POA: Insufficient documentation

## 2020-10-30 DIAGNOSIS — K219 Gastro-esophageal reflux disease without esophagitis: Secondary | ICD-10-CM

## 2020-10-30 DIAGNOSIS — Z87891 Personal history of nicotine dependence: Secondary | ICD-10-CM | POA: Insufficient documentation

## 2020-10-30 DIAGNOSIS — Z79899 Other long term (current) drug therapy: Secondary | ICD-10-CM | POA: Insufficient documentation

## 2020-10-30 DIAGNOSIS — M169 Osteoarthritis of hip, unspecified: Secondary | ICD-10-CM

## 2020-10-30 DIAGNOSIS — D1800 Hemangioma unspecified site: Secondary | ICD-10-CM

## 2020-10-30 DIAGNOSIS — E785 Hyperlipidemia, unspecified: Secondary | ICD-10-CM

## 2020-10-30 DIAGNOSIS — I10 Essential (primary) hypertension: Secondary | ICD-10-CM | POA: Diagnosis not present

## 2020-10-30 DIAGNOSIS — K3532 Acute appendicitis with perforation and localized peritonitis, without abscess: Principal | ICD-10-CM | POA: Insufficient documentation

## 2020-10-30 DIAGNOSIS — K353 Acute appendicitis with localized peritonitis, without perforation or gangrene: Secondary | ICD-10-CM

## 2020-10-30 DIAGNOSIS — E871 Hypo-osmolality and hyponatremia: Secondary | ICD-10-CM

## 2020-10-30 LAB — COMPREHENSIVE METABOLIC PANEL
ALT: 11 U/L (ref 0–44)
AST: 19 U/L (ref 15–41)
Albumin: 4.3 g/dL (ref 3.5–5.0)
Alkaline Phosphatase: 61 U/L (ref 38–126)
Anion gap: 9 (ref 5–15)
BUN: 13 mg/dL (ref 8–23)
CO2: 25 mmol/L (ref 22–32)
Calcium: 9 mg/dL (ref 8.9–10.3)
Chloride: 100 mmol/L (ref 98–111)
Creatinine, Ser: 1.13 mg/dL (ref 0.61–1.24)
GFR, Estimated: >60 mL/min (ref >60)
Glucose, Bld: 114 mg/dL — ABNORMAL HIGH (ref 70–99)
Potassium: 3.9 mmol/L (ref 3.5–5.1)
Sodium: 134 mmol/L — ABNORMAL LOW (ref 135–145)
Total Bilirubin: 0.9 mg/dL (ref 0.3–1.2)
Total Protein: 7.6 g/dL (ref 6.5–8.1)

## 2020-10-30 LAB — RESP PANEL BY RT-PCR (FLU A&B, COVID) ARPGX2
Influenza A by PCR: NEGATIVE
Influenza B by PCR: NEGATIVE
SARS Coronavirus 2 by RT PCR: NEGATIVE

## 2020-10-30 LAB — CBC WITH DIFFERENTIAL/PLATELET
Abs Immature Granulocytes: 0.01 10*3/uL (ref 0.00–0.07)
Basophils Absolute: 0 10*3/uL (ref 0.0–0.1)
Basophils Relative: 1 %
Eosinophils Absolute: 0.1 10*3/uL (ref 0.0–0.5)
Eosinophils Relative: 3 %
HCT: 44.3 % (ref 39.0–52.0)
Hemoglobin: 14.9 g/dL (ref 13.0–17.0)
Immature Granulocytes: 0 %
Lymphocytes Relative: 13 %
Lymphs Abs: 0.6 10*3/uL — ABNORMAL LOW (ref 0.7–4.0)
MCH: 30.4 pg (ref 26.0–34.0)
MCHC: 33.6 g/dL (ref 30.0–36.0)
MCV: 90.4 fL (ref 80.0–100.0)
Monocytes Absolute: 0.8 10*3/uL (ref 0.1–1.0)
Monocytes Relative: 17 %
Neutro Abs: 3.2 10*3/uL (ref 1.7–7.7)
Neutrophils Relative %: 66 %
Platelets: 236 10*3/uL (ref 150–400)
RBC: 4.9 MIL/uL (ref 4.22–5.81)
RDW: 12.6 % (ref 11.5–15.5)
WBC: 4.8 10*3/uL (ref 4.0–10.5)
nRBC: 0 % (ref 0.0–0.2)

## 2020-10-30 LAB — URINALYSIS, ROUTINE W REFLEX MICROSCOPIC
Bacteria, UA: NONE SEEN
Bilirubin Urine: NEGATIVE
Glucose, UA: NEGATIVE mg/dL
Hgb urine dipstick: NEGATIVE
Ketones, ur: 5 mg/dL — AB
Leukocytes,Ua: NEGATIVE
Nitrite: NEGATIVE
Protein, ur: 30 mg/dL — AB
Specific Gravity, Urine: 1.027 (ref 1.005–1.030)
pH: 5 (ref 5.0–8.0)

## 2020-10-30 LAB — LIPASE, BLOOD: Lipase: 30 U/L (ref 11–51)

## 2020-10-30 MED ORDER — PIPERACILLIN-TAZOBACTAM 3.375 G IVPB
3.3750 g | Freq: Three times a day (TID) | INTRAVENOUS | Status: DC
  Administered 2020-10-31: 3.375 g via INTRAVENOUS
  Filled 2020-10-30: qty 50

## 2020-10-30 MED ORDER — MORPHINE SULFATE (PF) 2 MG/ML IV SOLN
2.0000 mg | INTRAVENOUS | Status: DC | PRN
  Administered 2020-10-31: 2 mg via INTRAVENOUS
  Filled 2020-10-30: qty 1

## 2020-10-30 MED ORDER — PANTOPRAZOLE SODIUM 40 MG IV SOLR: 40.0000 mg | INTRAVENOUS | Status: DC

## 2020-10-30 MED ORDER — MORPHINE SULFATE (PF) 4 MG/ML IV SOLN
4.0000 mg | Freq: Once | INTRAVENOUS | Status: AC
  Administered 2020-10-30: 4 mg via INTRAVENOUS
  Filled 2020-10-30: qty 1

## 2020-10-30 MED ORDER — PIPERACILLIN-TAZOBACTAM 3.375 G IVPB 30 MIN: 3.3750 g | Freq: Four times a day (QID) | INTRAVENOUS | Status: DC

## 2020-10-30 MED ORDER — ONDANSETRON HCL 4 MG/2ML IJ SOLN
4.0000 mg | Freq: Once | INTRAMUSCULAR | Status: AC
  Administered 2020-10-30: 4 mg via INTRAVENOUS
  Filled 2020-10-30: qty 2

## 2020-10-30 MED ORDER — PIPERACILLIN-TAZOBACTAM 3.375 G IVPB 30 MIN
3.3750 g | Freq: Once | INTRAVENOUS | Status: AC
Start: 2020-10-30 — End: 2020-10-30
  Administered 2020-10-30: 3.375 g via INTRAVENOUS
  Filled 2020-10-30: qty 50

## 2020-10-30 MED ORDER — ACETAMINOPHEN 325 MG PO TABS: 650.0000 mg | ORAL_TABLET | Freq: Four times a day (QID) | ORAL | Status: DC | PRN

## 2020-10-30 MED ORDER — IOHEXOL 300 MG/ML  SOLN
100.0000 mL | Freq: Once | INTRAMUSCULAR | Status: AC | PRN
  Administered 2020-10-30: 100 mL via INTRAVENOUS

## 2020-10-30 MED ORDER — SODIUM CHLORIDE 0.9 % IV BOLUS
1000.0000 mL | Freq: Once | INTRAVENOUS | Status: AC
  Administered 2020-10-30: 1000 mL via INTRAVENOUS

## 2020-10-30 NOTE — H&P (Signed)
History and Physical  Arthur Moreno RWE:[id number] DOB: 07/12/1940 DOA: 10/30/2020  Referring physician: Isla Pence, MD PCP: [facility]  Patient coming from: Home  Chief Complaint: Abdominal Pain  HPI: Arthur Moreno is a 80 y.o. male with medical history significant for hypertension, hyperlipidemia, GERD and osteoarthritis who presents to the emergency department due to right lower quadrant abdominal pain which started last night, pain was rated at 9-10/10 on pain scale on onset, any movement/cough/applying pressure on affected area aggravates the pain, resting on recliner without moving alleviates the pain.  He denies fever, chills, nausea, vomiting, constipation or diarrhea.  He was concerned he may have appendicitis, so he went to the ED for further evaluation.  ED Course:  In the emergency department, he was hemodynamically stable.  Work-up in the ED showed normal CBC and BMP except for mild hyponatremia.  Lipase was 30 and urinalysis was unimpressive for UTI. CT abdomen and pelvis with contrast showed acute appendicitis with no abscess. Patient was started on Zosyn, morphine and Zofran were given and IV hydration was provided. General surgery was consulted and plan to take patient to the OR tomorrow.  Hospitalist was asked to admit patient for further evaluation and management.  Review of Systems: Constitutional: Negative for chills and fever.  HENT: Negative for ear pain and sore throat.   Eyes: Negative for pain and visual disturbance.  Respiratory: Negative for cough, chest tightness and shortness of breath.   Cardiovascular: Negative for chest pain and palpitations.  Gastrointestinal: Positive for abdominal pain and negative for vomiting.  Endocrine: Negative for polyphagia and polyuria.  Genitourinary: Negative for decreased urine volume, dysuria, enuresis Musculoskeletal: Negative for arthralgias and back pain.  Skin: Negative for color change and rash.   Allergic/Immunologic: Negative for immunocompromised state.  Neurological: Negative for tremors, syncope, speech difficulty, weakness, light-headedness and headaches.  Hematological: Does not bruise/bleed easily.  All other systems reviewed and are negative   Past Medical History:  Diagnosis Date  . Acid reflux   . Hypertension   . Low back pain   . Osteoarthritis    Past Surgical History:  Procedure Laterality Date  . HEMORRHOID SURGERY     At the age of 80yrs    Social History:  reports that he quit smoking about 9 years ago. He has never used smokeless tobacco. He reports previous alcohol use. He reports that he does not use drugs.   Not on File  No family history on file.   Prior to Admission medications   Medication Sig Start Date End Date Taking? Authorizing Provider  AMLODIPINE BESYLATE PO Take 5 mg by mouth.    [provider]  ATORVASTATIN CALCIUM PO Take 20 mg by mouth.    [provider]  FLUTICASONE PROPIONATE EX Apply topically.    [provider]  ibuprofen (ADVIL) 200 MG tablet Take 200 mg by mouth every 6 (six) hours as needed.    [provider]  LISINOPRIL PO Take 40 mg by mouth.    [provider]  loratadine (CLARITIN) 10 MG tablet Take 10 mg by mouth daily.    [provider]  Omeprazole (PRILOSEC PO) omeprazole    [provider]    Physical Exam: BP (!) 143/80 (BP Location: Left Arm)   Pulse 88   Temp 98.1 F (36.7 C)   Resp 18   Ht 5\' 7"  (1.702 m)   Wt 81.6 kg   SpO2 98%   BMI 28.19 kg/m   .  General: 80 y.o. year-old male well developed well nourished in no acute distress.  Alert and oriented x3. Marland Kitchen HEENT: NCAT, EOMI . Neck: Supple, trachea medial . Cardiovascular: Regular rate and rhythm with no rubs or gallops.  No thyromegaly or JVD noted.  No lower extremity edema. 2/4 pulses in all 4 extremities. Marland Kitchen Respiratory: Clear to auscultation with no wheezes or rales. Good  inspiratory effort. . Abdomen: Soft, tender to palpation in RLQ with no guarding. Normal bowel sounds x4 quadrants. . Muskuloskeletal: No cyanosis, clubbing or edema noted bilaterally . Neuro: CN II-XII intact, strength 5/5 x 4, sensation, reflexes intact . Skin: No ulcerative lesions noted or rashes . Psychiatry: Judgement and insight appear normal. Mood is appropriate for condition and setting          Labs on Admission:  Basic Metabolic Panel: Recent Labs  Lab 10/30/20 1945  NA 134*  K 3.9  CL 100  CO2 25  GLUCOSE 114*  BUN 13  CREATININE 1.13  CALCIUM 9.0   Liver Function Tests: Recent Labs  Lab 10/30/20 1945  AST 19  ALT 11  ALKPHOS 61  BILITOT 0.9  PROT 7.6  ALBUMIN 4.3   Recent Labs  Lab 10/30/20 1945  LIPASE 30   No results for input(s): AMMONIA in the last 168 hours. CBC: Recent Labs  Lab 10/30/20 1945  WBC 4.8  NEUTROABS 3.2  HGB 14.9  HCT 44.3  MCV 90.4  PLT 236   Cardiac Enzymes: No results for input(s): CKTOTAL, CKMB, CKMBINDEX, TROPONINI in the last 168 hours.  BNP (last 3 results) No results for input(s): BNP in the last 8760 hours.  ProBNP (last 3 results) No results for input(s): PROBNP in the last 8760 hours.  CBG: No results for input(s): GLUCAP in the last 168 hours.  Radiological Exams on Admission: CT Abdomen Pelvis W Contrast  Result Date: 10/30/2020 CLINICAL DATA:  Right lower quadrant abdominal pain starting last night. EXAM: CT ABDOMEN AND PELVIS WITH CONTRAST TECHNIQUE: Multidetector CT imaging of the abdomen and pelvis was performed using the standard protocol following bolus administration of intravenous contrast. CONTRAST:  168mL OMNIPAQUE IOHEXOL 300 MG/ML  SOLN COMPARISON:  None. FINDINGS: Lower chest: The lung bases are clear. Hepatobiliary: Mild diffuse fatty infiltration of the liver. Focal lesion in segment 7 measuring 2 cm diameter. There is peripheral nodular enhancement early, consistent with a benign cavernous  hemangioma. Gallbladder and bile ducts are unremarkable. Pancreas: Unremarkable. No pancreatic ductal dilatation or surrounding inflammatory changes. Spleen: Normal in size without focal abnormality. Adrenals/Urinary Tract: Adrenal glands are unremarkable. Kidneys are normal, without renal calculi, focal lesion, or hydronephrosis. Bladder is unremarkable. Stomach/Bowel: The stomach, small bowel, and colon are not abnormally distended. Scattered colonic diverticula without evidence of diverticulitis. The appendix is dilated, with appendiceal diameter measuring 1.5 cm. Periappendiceal stranding and edema consistent with acute appendicitis. Appendix: Location: Right lower quadrant retrocecal. Diameter: 15 mm Appendicolith: No Mucosal hyper-enhancement: Yes Extraluminal gas: No Periappendiceal collection: No Vascular/Lymphatic: Aortic atherosclerosis. No enlarged abdominal or pelvic lymph nodes. Reproductive: Prostate gland is enlarged, measuring 5.2 cm diameter. Other: No free air or free fluid in the abdomen. Abdominal wall musculature appears intact. Hazy mesenteric stranding likely representing inflammatory change such as mesenteritis. Musculoskeletal: Mild degenerative changes in the spine and hips. IMPRESSION: 1. Acute appendicitis. No abscess. 2. Mild diffuse fatty infiltration of the liver. 3. 2 cm diameter benign cavernous hemangioma in segment 7 of the liver. 4. Enlarged prostate gland. 5. Aortic atherosclerosis. 6. Hazy  mesenteric stranding likely representing inflammatory change such as mesenteritis. Aortic Atherosclerosis (ICD10-I70.0). Electronically Signed   By: Lucienne Capers M.D.   On: 10/30/2020 21:40    EKG: I independently viewed the EKG done and my findings are as followed: EKG was not done in the ED   Assessment/Plan Present on Admission: . Acute appendicitis  Principal Problem:   Acute appendicitis Active Problems:   Abdominal pain   Cavernous hemangioma   Essential hypertension    Hyperlipidemia   GERD (gastroesophageal reflux disease)   Osteoarthritis  Abdominal pain secondary to acute appendicitis CT abdomen and pelvis showed acute appendicitis without abscess Patient was started on IV Zosyn, we will continue same at this time Continue IV morphine 2 mg every 4 hours as needed Patient will be kept n.p.o. at midnight General surgery already consulted per ED physician and will see patient in the morning  Incidental finding of benign liver cavernous (hepatic) hemangioma CT abdomen and pelvis showed 2 cm diameter benign cavernous hemangioma in segment 7 of the liver Patient is asymptomatic.  He may need outpatient monitoring for development of symptoms/increased size by PCP  Mild Hyponatremia Na 134, Continue gentle hydration  GERD Continue Protonix  Essential hypertension Continue home meds when patient resumes oral intake  Hyperlipidemia Continue home atorvastatin when patient resumes oral intake  Osteoarthritis Continue Tylenol as needed when patient resumes oral intake    DVT prophylaxis: SCDs  Code Status: Full code  Family Communication: None at bedside  Disposition Plan:  Patient is from:                        home Anticipated DC to:                   SNF or family members home Anticipated DC date:               2-3 days Anticipated DC barriers:         patient requires inpatient management due to acute appendicitis pending surgical intervention  Consults called: General surgery  Admission status: Inpatient    Bernadette Hoit MD [facility]  10/30/2020, 10:45 PM

## 2020-10-30 NOTE — ED Triage Notes (Signed)
Pt c/o RLQ abdominal pain that started last night.

## 2020-10-30 NOTE — ED Provider Notes (Signed)
[facility] EMERGENCY DEPARTMENT Provider Note   CSN: [id number] Arrival date & time: 10/30/20  1901     History Chief Complaint  Patient presents with  . Abdominal Pain    Arthur Moreno is a 80 y.o. male.  Pt presents to the ED today with RLQ abdominal pain.  Sx started last night.  The pt has not had n/v.  No constipation or diarrhea.  No f/c.  Pt is concerned he may have appendicitis.        Past Medical History:  Diagnosis Date  . Acid reflux   . Hypertension   . Low back pain   . Osteoarthritis     Patient Active Problem List   Diagnosis Date Noted  . Pain in right hand 09/09/2019    Past Surgical History:  Procedure Laterality Date  . HEMORRHOID SURGERY     At the age of 62yrs       No family history on file.  Social History   Tobacco Use  . Smoking status: Former Smoker    Quit date: 06/07/2011    Years since quitting: 9.4  . Smokeless tobacco: Never Used  Substance Use Topics  . Alcohol use: Not Currently  . Drug use: Never    Home Medications Prior to Admission medications   Medication Sig Start Date End Date Taking? Authorizing Provider  AMLODIPINE BESYLATE PO Take 5 mg by mouth.    [provider]  ATORVASTATIN CALCIUM PO Take 20 mg by mouth.    [provider]  FLUTICASONE PROPIONATE EX Apply topically.    [provider]  ibuprofen (ADVIL) 200 MG tablet Take 200 mg by mouth every 6 (six) hours as needed.    [provider]  LISINOPRIL PO Take 40 mg by mouth.    [provider]  loratadine (CLARITIN) 10 MG tablet Take 10 mg by mouth daily.    [provider]  Omeprazole (PRILOSEC PO) omeprazole    [provider]    Allergies    Patient has no allergy information on record.  Review of Systems   Review of Systems  Gastrointestinal: Positive for abdominal pain.  All other systems reviewed and are negative.   Physical Exam Updated Vital Signs BP (!) 143/80 (BP  Location: Left Arm)   Pulse 88   Temp 98.1 F (36.7 C)   Resp 18   Ht 5\' 7"  (1.702 m)   Wt 81.6 kg   SpO2 98%   BMI 28.19 kg/m   Physical Exam Vitals and nursing note reviewed.  Constitutional:      Appearance: He is well-developed.  HENT:     Head: Normocephalic and atraumatic.     Mouth/Throat:     Mouth: Mucous membranes are moist.     Pharynx: Oropharynx is clear.  Eyes:     Extraocular Movements: Extraocular movements intact.     Pupils: Pupils are equal, round, and reactive to light.  Cardiovascular:     Rate and Rhythm: Normal rate and regular rhythm.     Heart sounds: Normal heart sounds.  Pulmonary:     Effort: Pulmonary effort is normal.     Breath sounds: Normal breath sounds.  Abdominal:     General: Abdomen is flat. Bowel sounds are normal.     Palpations: Abdomen is soft.     Tenderness: There is abdominal tenderness in the right lower quadrant.  Skin:    General: Skin is warm.     Capillary Refill: Capillary  refill takes less than 2 seconds.  Neurological:     General: No focal deficit present.     Mental Status: He is alert and oriented to person, place, and time.  Psychiatric:        Mood and Affect: Mood normal.        Behavior: Behavior normal.     ED Results / Procedures / Treatments   Labs (all labs ordered are listed, but only abnormal results are displayed) Labs Reviewed  CBC WITH DIFFERENTIAL/PLATELET - Abnormal; Notable for the following components:      Result Value   Lymphs Abs 0.6 (*)    All other components within normal limits  COMPREHENSIVE METABOLIC PANEL - Abnormal; Notable for the following components:   Sodium 134 (*)    Glucose, Bld 114 (*)    All other components within normal limits  URINALYSIS, ROUTINE W REFLEX MICROSCOPIC - Abnormal; Notable for the following components:   Ketones, ur 5 (*)    Protein, ur 30 (*)    All other components within normal limits  RESP PANEL BY RT-PCR (FLU A&B, COVID) ARPGX2  LIPASE, BLOOD     EKG None  Radiology CT Abdomen Pelvis W Contrast  Result Date: 10/30/2020 CLINICAL DATA:  Right lower quadrant abdominal pain starting last night. EXAM: CT ABDOMEN AND PELVIS WITH CONTRAST TECHNIQUE: Multidetector CT imaging of the abdomen and pelvis was performed using the standard protocol following bolus administration of intravenous contrast. CONTRAST:  166mL OMNIPAQUE IOHEXOL 300 MG/ML  SOLN COMPARISON:  None. FINDINGS: Lower chest: The lung bases are clear. Hepatobiliary: Mild diffuse fatty infiltration of the liver. Focal lesion in segment 7 measuring 2 cm diameter. There is peripheral nodular enhancement early, consistent with a benign cavernous hemangioma. Gallbladder and bile ducts are unremarkable. Pancreas: Unremarkable. No pancreatic ductal dilatation or surrounding inflammatory changes. Spleen: Normal in size without focal abnormality. Adrenals/Urinary Tract: Adrenal glands are unremarkable. Kidneys are normal, without renal calculi, focal lesion, or hydronephrosis. Bladder is unremarkable. Stomach/Bowel: The stomach, small bowel, and colon are not abnormally distended. Scattered colonic diverticula without evidence of diverticulitis. The appendix is dilated, with appendiceal diameter measuring 1.5 cm. Periappendiceal stranding and edema consistent with acute appendicitis. Appendix: Location: Right lower quadrant retrocecal. Diameter: 15 mm Appendicolith: No Mucosal hyper-enhancement: Yes Extraluminal gas: No Periappendiceal collection: No Vascular/Lymphatic: Aortic atherosclerosis. No enlarged abdominal or pelvic lymph nodes. Reproductive: Prostate gland is enlarged, measuring 5.2 cm diameter. Other: No free air or free fluid in the abdomen. Abdominal wall musculature appears intact. Hazy mesenteric stranding likely representing inflammatory change such as mesenteritis. Musculoskeletal: Mild degenerative changes in the spine and hips. IMPRESSION: 1. Acute appendicitis. No abscess. 2. Mild  diffuse fatty infiltration of the liver. 3. 2 cm diameter benign cavernous hemangioma in segment 7 of the liver. 4. Enlarged prostate gland. 5. Aortic atherosclerosis. 6. Hazy mesenteric stranding likely representing inflammatory change such as mesenteritis. Aortic Atherosclerosis (ICD10-I70.0). Electronically Signed   By: Lucienne Capers M.D.   On: 10/30/2020 21:40    Procedures Procedures   Medications Ordered in ED Medications  piperacillin-tazobactam (ZOSYN) IVPB 3.375 g (3.375 g Intravenous New Bag/Given 10/30/20 2206)  sodium chloride 0.9 % bolus 1,000 mL (1,000 mLs Intravenous New Bag/Given 10/30/20 2023)  iohexol (OMNIPAQUE) 300 MG/ML solution 100 mL (100 mLs Intravenous Contrast Given 10/30/20 2108)  ondansetron (ZOFRAN) injection 4 mg (4 mg Intravenous Given 10/30/20 2159)  morphine 4 MG/ML injection 4 mg (4 mg Intravenous Given 10/30/20 2201)    ED Course  I have reviewed the triage vital signs and the nursing notes.  Pertinent labs & imaging results that were available during my care of the patient were reviewed by me and considered in my medical decision making (see chart for details).    MDM Rules/Calculators/A&P                          Pt's CT scan does reveal acute appendicitis.  No abscess.  Pt given a dose of zosyn.  The pt has been vaccinated + booster against Covid.    Pt d/w Dr. Arnoldo Morale.  Plan for OR tomorrow.  NPO after midnight.  He requests that the hospitalists admit.  Pt d/w Dr. Josephine Cables ([facility]) who will admit.   Final Clinical Impression(s) / ED Diagnoses Final diagnoses:  Acute appendicitis with localized peritonitis, without perforation, abscess, or gangrene    Rx / DC Orders ED Discharge Orders    None       Isla Pence, MD 10/30/20 2218

## 2020-10-31 ENCOUNTER — Encounter (HOSPITAL_COMMUNITY)
Admission: EM | Disposition: A | Discharge status: Home / Self Care | Payer: Self-pay | Source: Home / Self Care | Attending: Emergency Medicine

## 2020-10-31 ENCOUNTER — Encounter (HOSPITAL_COMMUNITY): Payer: Self-pay | Admitting: Internal Medicine

## 2020-10-31 ENCOUNTER — Inpatient Hospital Stay (HOSPITAL_COMMUNITY): Payer: Medicare PPO | Admitting: Anesthesiology

## 2020-10-31 DIAGNOSIS — Z20822 Contact with and (suspected) exposure to covid-19: Secondary | ICD-10-CM | POA: Diagnosis not present

## 2020-10-31 DIAGNOSIS — K3580 Unspecified acute appendicitis: Secondary | ICD-10-CM | POA: Diagnosis present

## 2020-10-31 DIAGNOSIS — K353 Acute appendicitis with localized peritonitis, without perforation or gangrene: Secondary | ICD-10-CM | POA: Diagnosis not present

## 2020-10-31 DIAGNOSIS — E871 Hypo-osmolality and hyponatremia: Secondary | ICD-10-CM | POA: Diagnosis not present

## 2020-10-31 DIAGNOSIS — I10 Essential (primary) hypertension: Secondary | ICD-10-CM | POA: Diagnosis not present

## 2020-10-31 DIAGNOSIS — K3532 Acute appendicitis with perforation and localized peritonitis, without abscess: Secondary | ICD-10-CM | POA: Diagnosis not present

## 2020-10-31 HISTORY — PX: LAPAROSCOPIC APPENDECTOMY: SHX408

## 2020-10-31 LAB — CBC
HCT: 41.7 % (ref 39.0–52.0)
Hemoglobin: 14 g/dL (ref 13.0–17.0)
MCH: 30.5 pg (ref 26.0–34.0)
MCHC: 33.6 g/dL (ref 30.0–36.0)
MCV: 90.8 fL (ref 80.0–100.0)
Platelets: 211 10*3/uL (ref 150–400)
RBC: 4.59 MIL/uL (ref 4.22–5.81)
RDW: 12.7 % (ref 11.5–15.5)
WBC: 6.4 10*3/uL (ref 4.0–10.5)
nRBC: 0 % (ref 0.0–0.2)

## 2020-10-31 LAB — COMPREHENSIVE METABOLIC PANEL
ALT: 11 U/L (ref 0–44)
AST: 15 U/L (ref 15–41)
Albumin: 3.6 g/dL (ref 3.5–5.0)
Alkaline Phosphatase: 50 U/L (ref 38–126)
Anion gap: 8 (ref 5–15)
BUN: 13 mg/dL (ref 8–23)
CO2: 24 mmol/L (ref 22–32)
Calcium: 8.7 mg/dL — ABNORMAL LOW (ref 8.9–10.3)
Chloride: 103 mmol/L (ref 98–111)
Creatinine, Ser: 1.01 mg/dL (ref 0.61–1.24)
GFR, Estimated: >60 mL/min (ref >60)
Glucose, Bld: 129 mg/dL — ABNORMAL HIGH (ref 70–99)
Potassium: 4.2 mmol/L (ref 3.5–5.1)
Sodium: 135 mmol/L (ref 135–145)
Total Bilirubin: 1.1 mg/dL (ref 0.3–1.2)
Total Protein: 6.7 g/dL (ref 6.5–8.1)

## 2020-10-31 LAB — PROTIME-INR
INR: 1.1 (ref 0.8–1.2)
Prothrombin Time: 14.3 seconds (ref 11.4–15.2)

## 2020-10-31 LAB — MAGNESIUM: Magnesium: 1.8 mg/dL (ref 1.7–2.4)

## 2020-10-31 LAB — PHOSPHORUS: Phosphorus: 3.2 mg/dL (ref 2.5–4.6)

## 2020-10-31 LAB — APTT: aPTT: 28 seconds (ref 24–36)

## 2020-10-31 SURGERY — APPENDECTOMY, LAPAROSCOPIC
Anesthesia: General | Site: Abdomen

## 2020-10-31 MED ORDER — SUGAMMADEX SODIUM 200 MG/2ML IV SOLN
INTRAVENOUS | Status: DC | PRN
  Administered 2020-10-31: 200 mg via INTRAVENOUS

## 2020-10-31 MED ORDER — HEMOSTATIC AGENTS (NO CHARGE) OPTIME
TOPICAL | Status: DC | PRN
  Administered 2020-10-31 (×2): 1 via TOPICAL

## 2020-10-31 MED ORDER — ACETAMINOPHEN 325 MG PO TABS: 650.0000 mg | ORAL_TABLET | Freq: Four times a day (QID) | ORAL | Status: DC | PRN

## 2020-10-31 MED ORDER — FENTANYL CITRATE (PF) 100 MCG/2ML IJ SOLN
25.0000 ug | INTRAMUSCULAR | Status: DC | PRN
  Administered 2020-10-31 (×2): 50 ug via INTRAVENOUS
  Filled 2020-10-31: qty 2

## 2020-10-31 MED ORDER — PROPOFOL 10 MG/ML IV BOLUS
INTRAVENOUS | Status: DC | PRN
  Administered 2020-10-31: 100 mg via INTRAVENOUS

## 2020-10-31 MED ORDER — LACTATED RINGERS IV SOLN: INTRAVENOUS | Status: DC | PRN

## 2020-10-31 MED ORDER — ROCURONIUM BROMIDE 100 MG/10ML IV SOLN
INTRAVENOUS | Status: DC | PRN
  Administered 2020-10-31: 20 mg via INTRAVENOUS

## 2020-10-31 MED ORDER — CHLORHEXIDINE GLUCONATE CLOTH 2 % EX PADS
6.0000 | MEDICATED_PAD | Freq: Once | CUTANEOUS | Status: AC
Start: 2020-10-31 — End: 2020-10-31
  Administered 2020-10-31: 6 via TOPICAL

## 2020-10-31 MED ORDER — PROPOFOL 10 MG/ML IV BOLUS
INTRAVENOUS | Status: AC
  Filled 2020-10-31: qty 20

## 2020-10-31 MED ORDER — BUPIVACAINE LIPOSOME 1.3 % IJ SUSP
INTRAMUSCULAR | Status: DC | PRN
  Administered 2020-10-31: 20 mL

## 2020-10-31 MED ORDER — DIPHENHYDRAMINE HCL 50 MG/ML IJ SOLN: 12.5000 mg | Freq: Four times a day (QID) | INTRAMUSCULAR | Status: DC | PRN

## 2020-10-31 MED ORDER — SIMETHICONE 80 MG PO CHEW: 40.0000 mg | CHEWABLE_TABLET | Freq: Four times a day (QID) | ORAL | Status: DC | PRN

## 2020-10-31 MED ORDER — CHLORHEXIDINE GLUCONATE 0.12 % MT SOLN: 15.0000 mL | Freq: Once | OROMUCOSAL | Status: DC

## 2020-10-31 MED ORDER — ACETAMINOPHEN 650 MG RE SUPP: 650.0000 mg | Freq: Four times a day (QID) | RECTAL | Status: DC | PRN

## 2020-10-31 MED ORDER — ONDANSETRON HCL 4 MG/2ML IJ SOLN
4.0000 mg | Freq: Four times a day (QID) | INTRAMUSCULAR | Status: DC | PRN
Start: 2020-10-31 — End: 2020-10-31

## 2020-10-31 MED ORDER — DIPHENHYDRAMINE HCL 12.5 MG/5ML PO ELIX
12.5000 mg | ORAL_SOLUTION | Freq: Four times a day (QID) | ORAL | Status: DC | PRN
Start: 2020-10-31 — End: 2020-10-31

## 2020-10-31 MED ORDER — HYDROCODONE-ACETAMINOPHEN 5-325 MG PO TABS
1.0000 | ORAL_TABLET | ORAL | Status: DC | PRN
  Administered 2020-10-31: 1 via ORAL
  Filled 2020-10-31: qty 1

## 2020-10-31 MED ORDER — IPRATROPIUM-ALBUTEROL 0.5-2.5 (3) MG/3ML IN SOLN
RESPIRATORY_TRACT | Status: AC
  Filled 2020-10-31: qty 3

## 2020-10-31 MED ORDER — ORAL CARE MOUTH RINSE: 15.0000 mL | Freq: Once | OROMUCOSAL | Status: DC

## 2020-10-31 MED ORDER — ENOXAPARIN SODIUM 40 MG/0.4ML ~~LOC~~ SOLN: 40.0000 mg | SUBCUTANEOUS | Status: DC

## 2020-10-31 MED ORDER — AMLODIPINE BESYLATE 5 MG PO TABS: 5.0000 mg | ORAL_TABLET | Freq: Every day | ORAL | Status: DC

## 2020-10-31 MED ORDER — ONDANSETRON 4 MG PO TBDP: 4.0000 mg | ORAL_TABLET | Freq: Four times a day (QID) | ORAL | Status: DC | PRN

## 2020-10-31 MED ORDER — SODIUM CHLORIDE 0.9 % IV SOLN: INTRAVENOUS | Status: DC

## 2020-10-31 MED ORDER — IPRATROPIUM-ALBUTEROL 0.5-2.5 (3) MG/3ML IN SOLN
3.0000 mL | RESPIRATORY_TRACT | Status: DC
  Administered 2020-10-31: 3 mL via RESPIRATORY_TRACT

## 2020-10-31 MED ORDER — LACTATED RINGERS IV SOLN: INTRAVENOUS | Status: DC

## 2020-10-31 MED ORDER — BUPIVACAINE LIPOSOME 1.3 % IJ SUSP
INTRAMUSCULAR | Status: AC
  Filled 2020-10-31: qty 20

## 2020-10-31 MED ORDER — FENTANYL CITRATE (PF) 100 MCG/2ML IJ SOLN
INTRAMUSCULAR | Status: DC | PRN
  Administered 2020-10-31 (×2): 50 ug via INTRAVENOUS

## 2020-10-31 MED ORDER — ONDANSETRON HCL 4 MG/2ML IJ SOLN: 4.0000 mg | Freq: Once | INTRAMUSCULAR | Status: DC | PRN

## 2020-10-31 MED ORDER — MORPHINE SULFATE (PF) 2 MG/ML IV SOLN: 2.0000 mg | INTRAVENOUS | Status: DC | PRN

## 2020-10-31 MED ORDER — FENTANYL CITRATE (PF) 100 MCG/2ML IJ SOLN
INTRAMUSCULAR | Status: AC
  Filled 2020-10-31: qty 2

## 2020-10-31 MED ORDER — LISINOPRIL 10 MG PO TABS
40.0000 mg | ORAL_TABLET | Freq: Every day | ORAL | Status: DC
  Administered 2020-10-31: 40 mg via ORAL
  Filled 2020-10-31: qty 4

## 2020-10-31 MED ORDER — HYDROCODONE-ACETAMINOPHEN 5-325 MG PO TABS: 1.0000 | ORAL_TABLET | Freq: Four times a day (QID) | ORAL | 0 refills | Status: DC | PRN

## 2020-10-31 MED ORDER — PIPERACILLIN-TAZOBACTAM 3.375 G IVPB
3.3750 g | Freq: Three times a day (TID) | INTRAVENOUS | Status: DC
  Administered 2020-10-31: 3.375 g via INTRAVENOUS
  Filled 2020-10-31: qty 50

## 2020-10-31 MED ORDER — SODIUM CHLORIDE 0.9 % IR SOLN
Status: DC | PRN
  Administered 2020-10-31: 1000 mL

## 2020-10-31 MED ORDER — KETOROLAC TROMETHAMINE 30 MG/ML IJ SOLN
15.0000 mg | Freq: Once | INTRAMUSCULAR | Status: AC
  Administered 2020-10-31: 15 mg via INTRAVENOUS
  Filled 2020-10-31: qty 1

## 2020-10-31 MED ORDER — ATORVASTATIN CALCIUM 20 MG PO TABS: 20.0000 mg | ORAL_TABLET | Freq: Every day | ORAL | Status: DC

## 2020-10-31 MED ORDER — CHLORHEXIDINE GLUCONATE CLOTH 2 % EX PADS
6.0000 | MEDICATED_PAD | Freq: Once | CUTANEOUS | Status: AC
  Administered 2020-10-31: 6 via TOPICAL

## 2020-10-31 MED ORDER — SUCCINYLCHOLINE CHLORIDE 20 MG/ML IJ SOLN
INTRAMUSCULAR | Status: DC | PRN
  Administered 2020-10-31: 100 mg via INTRAVENOUS

## 2020-10-31 SURGICAL SUPPLY — 49 items
APPLICATOR ARISTA FLEXITIP XL (MISCELLANEOUS) ×1 IMPLANT
BAG RETRIEVAL 10 (BASKET) ×1
CHLORAPREP W/TINT 26 (MISCELLANEOUS) ×2 IMPLANT
CLOTH BEACON ORANGE TIMEOUT ST (SAFETY) ×2 IMPLANT
COVER LIGHT HANDLE STERIS (MISCELLANEOUS) ×4 IMPLANT
COVER WAND RF STERILE (DRAPES) ×2 IMPLANT
CUTTER FLEX LINEAR 45M (STAPLE) ×2 IMPLANT
DERMABOND ADVANCED (GAUZE/BANDAGES/DRESSINGS) ×1
DERMABOND ADVANCED .7 DNX12 (GAUZE/BANDAGES/DRESSINGS) ×1 IMPLANT
ELECT REM PT RETURN 9FT ADLT (ELECTROSURGICAL) ×2
ELECTRODE REM PT RTRN 9FT ADLT (ELECTROSURGICAL) ×1 IMPLANT
GLOVE SURG SS PI 7.5 STRL IVOR (GLOVE) ×2 IMPLANT
GLOVE SURG UNDER POLY LF SZ7 (GLOVE) ×6 IMPLANT
GOWN STRL REUS W/TWL LRG LVL3 (GOWN DISPOSABLE) ×4 IMPLANT
HEMOSTAT ARISTA ABSORB 1G (HEMOSTASIS) ×1 IMPLANT
HEMOSTAT SNOW SURGICEL 2X4 (HEMOSTASIS) ×1 IMPLANT
INST SET LAPROSCOPIC AP (KITS) ×2 IMPLANT
KIT TURNOVER KIT A (KITS) ×2 IMPLANT
MANIFOLD NEPTUNE II (INSTRUMENTS) ×2 IMPLANT
NDL HYPO 18GX1.5 BLUNT FILL (NEEDLE) ×1 IMPLANT
NDL HYPO 21X1.5 SAFETY (NEEDLE) ×1 IMPLANT
NDL INSUFFLATION 14GA 120MM (NEEDLE) ×1 IMPLANT
NEEDLE HYPO 18GX1.5 BLUNT FILL (NEEDLE) ×2 IMPLANT
NEEDLE HYPO 21X1.5 SAFETY (NEEDLE) ×2 IMPLANT
NEEDLE INSUFFLATION 14GA 120MM (NEEDLE) ×2 IMPLANT
NS IRRIG 1000ML POUR BTL (IV SOLUTION) ×2 IMPLANT
PACK LAP CHOLE LZT030E (CUSTOM PROCEDURE TRAY) ×2 IMPLANT
PAD ARMBOARD 7.5X6 YLW CONV (MISCELLANEOUS) ×2 IMPLANT
PENCIL SMOKE EVACUATOR COATED (MISCELLANEOUS) ×2 IMPLANT
RELOAD 45 VASCULAR/THIN (ENDOMECHANICALS) ×2 IMPLANT
RELOAD STAPLE 45 2.5 WHT GRN (ENDOMECHANICALS) IMPLANT
RELOAD STAPLE 45 3.5 BLU ETS (ENDOMECHANICALS) IMPLANT
RELOAD STAPLE TA45 3.5 REG BLU (ENDOMECHANICALS) IMPLANT
SET BASIN LINEN APH (SET/KITS/TRAYS/PACK) ×2 IMPLANT
SET TUBE IRRIG SUCTION NO TIP (IRRIGATION / IRRIGATOR) ×2 IMPLANT
SET TUBE SMOKE EVAC HIGH FLOW (TUBING) ×2 IMPLANT
SHEARS HARMONIC ACE PLUS 36CM (ENDOMECHANICALS) ×2 IMPLANT
SUT MNCRL AB 4-0 PS2 18 (SUTURE) ×4 IMPLANT
SUT VICRYL 0 UR6 27IN ABS (SUTURE) ×2 IMPLANT
SYR 20ML LL LF (SYRINGE) ×4 IMPLANT
SYS BAG RETRIEVAL 10MM (BASKET) ×1
SYSTEM BAG RETRIEVAL 10MM (BASKET) ×1 IMPLANT
TRAY FOLEY W/BAG SLVR 16FR (SET/KITS/TRAYS/PACK) ×1
TRAY FOLEY W/BAG SLVR 16FR ST (SET/KITS/TRAYS/PACK) ×1 IMPLANT
TROCAR ENDO BLADELESS 11MM (ENDOMECHANICALS) ×2 IMPLANT
TROCAR ENDO BLADELESS 12MM (ENDOMECHANICALS) ×2 IMPLANT
TROCAR XCEL NON-BLD 5MMX100MML (ENDOMECHANICALS) ×2 IMPLANT
TUBE CONNECTING 12X1/4 (SUCTIONS) ×2 IMPLANT
WARMER LAPAROSCOPE (MISCELLANEOUS) ×2 IMPLANT

## 2020-10-31 NOTE — Discharge Instructions (Signed)
Laparoscopic Appendectomy, Adult, Care After This sheet gives you information about how to care for yourself after your procedure. Your health care provider may also give you more specific instructions. If you have problems or questions, contact your health care provider. What can I expect after the procedure? After the procedure, it is common to have:  Little energy for normal activities.  Mild pain in the area where the incisions were made.  Difficulty passing stool (constipation). This can be caused by: ? Pain medicine. ? A decrease in your activity. Follow these instructions at home: Medicines  Take over-the-counter and prescription medicines only as told by your health care provider.  If you were prescribed an antibiotic medicine, take it as told by your health care provider. Do not stop taking the antibiotic even if you start to feel better.  Do not drive or use heavy machinery while taking prescription pain medicine.  Ask your health care provider if the medicine prescribed to you can cause constipation. You may need to take steps to prevent or treat constipation, such as: ? Drink enough fluid to keep your urine pale yellow. ? Take over-the-counter or prescription medicines. ? Eat foods that are high in fiber, such as beans, whole grains, and fresh fruits and vegetables. ? Limit foods that are high in fat and processed sugars, such as fried or sweet foods. Incision care  Follow instructions from your health care provider about how to take care of your incisions. Make sure you: ? Wash your hands with soap and water before and after you change your bandage (dressing). If soap and water are not available, use hand sanitizer. ? Change your dressing as told by your health care provider. ? Leave stitches (sutures), skin glue, or adhesive strips in place. These skin closures may need to stay in place for 2 weeks or longer. If adhesive strip edges start to loosen and curl up, you may  trim the loose edges. Do not remove adhesive strips completely unless your health care provider tells you to do that.  Check your incision areas every day for signs of infection. Check for: ? Redness, swelling, or pain. ? Fluid or blood. ? Warmth. ? Pus or a bad smell.   Bathing  Keep your incisions clean and dry. Clean them as often as told by your health care provider. To do this: 1. Gently wash the incisions with soap and water. 2. Rinse the incisions with water to remove all soap. 3. Pat the incisions dry with a clean towel. Do not rub the incisions.  Do not take baths, swim, or use a hot tub for 2 weeks, or until your health care provider approves. You may take showers after 48 hours. Activity  Do not drive for 24 hours if you were given a sedative during your procedure.  Rest after the procedure. Return to your normal activities as told by your health care provider. Ask your health care provider what activities are safe for you.  For 3 weeks, or for as long as told by your health care provider: ? Do not lift anything that is heavier than 10 lb (4.5 kg), or the limit that you are told. ? Do not play contact sports.   General instructions  If you were sent home with a drain, follow instructions from your health care provider about how to care for it.  Take deep breaths. This helps to prevent your lungs from developing an infection (pneumonia).  Keep all follow-up visits as told by  your health care provider. This is important. °Contact a health care provider if: °· You have redness, swelling, or pain around an incision. °· You have fluid or blood coming from an incision. °· Your incision feels warm to the touch. °· You have pus or a bad smell coming from an incision or dressing. °· Your incision edges break open after your sutures have been removed. °· You have increasing pain in your shoulders. °· You feel dizzy or you faint. °· You develop shortness of breath. °· You keep feeling  nauseous or you are vomiting. °· You have diarrhea or you cannot control your bowel functions. °· You lose your appetite. °· You develop swelling or pain in your legs. °· You develop a rash. °Get help right away if you have: °· A fever. °· Difficulty breathing. °· Sharp pains in your chest. °Summary °· After a laparoscopic appendectomy, it is common to have little energy for normal activities, mild pain in the area of the incisions, and constipation. °· Infection is the most common complication after this procedure. Follow your health care provider's instructions about caring for yourself after the procedure. °· Rest after the procedure. Return to your normal activities as told by your health care provider. °· Contact your health care provider if you notice signs of infection around your incisions or you develop shortness of breath. Get help right away if you have a fever, chest pain, or difficulty breathing. °This information is not intended to replace advice given to you by your health care provider. Make sure you discuss any questions you have with your health care provider. °Document Revised: 12/27/2017 Document Reviewed: 12/27/2017 °Elsevier Patient Education © 2021 [employer] ° °

## 2020-10-31 NOTE — Care Management Obs Status (Signed)
[facility] NOTIFICATION   Patient Details  Name: Arthur Moreno MRN: [id number] Date of Birth: April 26, 1941   Medicare Observation Status Notification Given:  Yes   Patient in PACU with dc order. CSW is working remote unable to reach patient. CSW called patients wife with no answer. Letter provided with discharge paperwork.  Jemarcus Dougal A Kidus Delman, LCSW 10/31/2020, 10:17 AM

## 2020-10-31 NOTE — Anesthesia Postprocedure Evaluation (Signed)
Anesthesia Post Note  Patient: Arthur Moreno  Procedure(s) Performed: APPENDECTOMY LAPAROSCOPIC (N/A Abdomen)  Patient location during evaluation: Phase II Anesthesia Type: General Level of consciousness: awake Pain management: pain level controlled Vital Signs Assessment: post-procedure vital signs reviewed and stable Respiratory status: spontaneous breathing and respiratory function stable Cardiovascular status: blood pressure returned to baseline and stable Postop Assessment: no headache and no apparent nausea or vomiting Anesthetic complications: no Comments: Late entry   No complications documented.   Last Vitals:  Vitals:   10/31/20 0619 10/31/20 0918  BP: 132/73 (!) 157/91  Pulse: 93 (!) 102  Resp: 18 (!) 22  Temp: 37.1 C 37 C  SpO2: 95% 100%    Last Pain:  Vitals:   10/31/20 0619  TempSrc: Oral  PainSc:                  Louann Sjogren

## 2020-10-31 NOTE — Consult Note (Signed)
Reason for Consult: Right lower quadrant abdominal pain Referring Physician: Dr. Evern Bio is an 80 y.o. male.  HPI: Patient is a 80 year old white male who presented to the emergency room with a 24-hour history of worsening right lower quadrant abdominal pain.  CT scan of the abdomen revealed acute appendicitis.  Patient states he has a decreased appetite.  He denies any fever or chills.  Past Medical History:  Diagnosis Date  . Acid reflux   . Hypertension   . Low back pain   . Osteoarthritis     Past Surgical History:  Procedure Laterality Date  . HEMORRHOID SURGERY     At the age of 80yrs    No family history on file.  Social History:  reports that he quit smoking about 9 years ago. He has never used smokeless tobacco. He reports previous alcohol use. He reports that he does not use drugs.  Allergies: Not on File  Medications: I have reviewed the patient's current medications.  Results for orders placed or performed during the hospital encounter of 10/30/20 (from the past 48 hour(s))  CBC with Differential     Status: Abnormal   Collection Time: 10/30/20  7:45 PM  Result Value Ref Range   WBC 4.8 4.0 - 10.5 K/uL   RBC 4.90 4.22 - 5.81 MIL/uL   Hemoglobin 14.9 13.0 - 17.0 g/dL   HCT 44.3 39.0 - 52.0 %   MCV 90.4 80.0 - 100.0 fL   MCH 30.4 26.0 - 34.0 pg   MCHC 33.6 30.0 - 36.0 g/dL   RDW 12.6 11.5 - 15.5 %   Platelets 236 150 - 400 K/uL   nRBC 0.0 0.0 - 0.2 %   Neutrophils Relative % 66 %   Neutro Abs 3.2 1.7 - 7.7 K/uL   Lymphocytes Relative 13 %   Lymphs Abs 0.6 (L) 0.7 - 4.0 K/uL   Monocytes Relative 17 %   Monocytes Absolute 0.8 0.1 - 1.0 K/uL   Eosinophils Relative 3 %   Eosinophils Absolute 0.1 0.0 - 0.5 K/uL   Basophils Relative 1 %   Basophils Absolute 0.0 0.0 - 0.1 K/uL   Immature Granulocytes 0 %   Abs Immature Granulocytes 0.01 0.00 - 0.07 K/uL    Comment: Performed at [facility], [address], [place], [place] [address]   Comprehensive metabolic panel     Status: Abnormal   Collection Time: 10/30/20  7:45 PM  Result Value Ref Range   Sodium 134 (L) 135 - 145 mmol/L   Potassium 3.9 3.5 - 5.1 mmol/L   Chloride 100 98 - 111 mmol/L   CO2 25 22 - 32 mmol/L   Glucose, Bld 114 (H) 70 - 99 mg/dL    Comment: Glucose reference range applies only to samples taken after fasting for at least 8 hours.   BUN 13 8 - 23 mg/dL   Creatinine, Ser 1.13 0.61 - 1.24 mg/dL   Calcium 9.0 8.9 - 10.3 mg/dL   Total Protein 7.6 6.5 - 8.1 g/dL   Albumin 4.3 3.5 - 5.0 g/dL   AST 19 15 - 41 U/L   ALT 11 0 - 44 U/L   Alkaline Phosphatase 61 38 - 126 U/L   Total Bilirubin 0.9 0.3 - 1.2 mg/dL   GFR, Estimated >60 >60 mL/min    Comment: (NOTE) Calculated using the CKD-EPI Creatinine Equation (2021)    Anion gap 9 5 - 15    Comment: Performed at [facility],  [address], [place], [place] [address]  Lipase, blood     Status: None   Collection Time: 10/30/20  7:45 PM  Result Value Ref Range   Lipase 30 11 - 51 U/L    Comment: Performed at [facility], [address], [place], [place] [address]  Urinalysis, Routine w reflex microscopic Urine, Clean Catch     Status: Abnormal   Collection Time: 10/30/20  8:25 PM  Result Value Ref Range   Color, Urine YELLOW YELLOW   APPearance CLEAR CLEAR   Specific Gravity, Urine 1.027 1.005 - 1.030   pH 5.0 5.0 - 8.0   Glucose, UA NEGATIVE NEGATIVE mg/dL   Hgb urine dipstick NEGATIVE NEGATIVE   Bilirubin Urine NEGATIVE NEGATIVE   Ketones, ur 5 (A) NEGATIVE mg/dL   Protein, ur 30 (A) NEGATIVE mg/dL   Nitrite NEGATIVE NEGATIVE   Leukocytes,Ua NEGATIVE NEGATIVE   RBC / HPF 0-5 0 - 5 RBC/hpf   WBC, UA 0-5 0 - 5 WBC/hpf   Bacteria, UA NONE SEEN NONE SEEN   Squamous Epithelial / LPF 0-5 0 - 5   Mucus PRESENT     Comment: Performed at [facility], [address], [place], [place] [address]  Resp Panel by RT-PCR (Flu A&B, Covid) Nasopharyngeal Swab     Status: None   Collection Time: 10/30/20  10:30 PM   Specimen: Nasopharyngeal Swab; Nasopharyngeal(NP) swabs in vial transport medium  Result Value Ref Range   SARS Coronavirus 2 by RT PCR NEGATIVE NEGATIVE    Comment: (NOTE) SARS-CoV-2 target nucleic acids are NOT DETECTED.  The SARS-CoV-2 RNA is generally detectable in upper respiratory specimens during the acute phase of infection. The lowest concentration of SARS-CoV-2 viral copies this assay can detect is 138 copies/mL. A negative result does not preclude SARS-Cov-2 infection and should not be used as the sole basis for treatment or other patient management decisions. A negative result may occur with  improper specimen collection/handling, submission of specimen other than nasopharyngeal swab, presence of viral mutation(s) within the areas targeted by this assay, and inadequate number of viral copies(<138 copies/mL). A negative result must be combined with clinical observations, patient history, and epidemiological information. The expected result is Negative.  Fact Sheet for Patients:  EntrepreneurPulse.com.au  Fact Sheet for Healthcare Providers:  IncredibleEmployment.be  This test is no t yet approved or cleared by the [place] FDA and  has been authorized for detection and/or diagnosis of SARS-CoV-2 by FDA under an Emergency Use Authorization (EUA). This EUA will remain  in effect (meaning this test can be used) for the duration of the COVID-19 declaration under Section 564(b)(1) of the Act, 21 U.S.C.section 360bbb-3(b)(1), unless the authorization is terminated  or revoked sooner.       Influenza A by PCR NEGATIVE NEGATIVE   Influenza B by PCR NEGATIVE NEGATIVE    Comment: (NOTE) The Xpert Xpress SARS-CoV-2/FLU/RSV plus assay is intended as an aid in the diagnosis of influenza from Nasopharyngeal swab specimens and should not be used as a sole basis for treatment. Nasal washings and aspirates are unacceptable for  Xpert Xpress SARS-CoV-2/FLU/RSV testing.  Fact Sheet for Patients: EntrepreneurPulse.com.au  Fact Sheet for Healthcare Providers: IncredibleEmployment.be  This test is not yet approved or cleared by the [place] FDA and has been authorized for detection and/or diagnosis of SARS-CoV-2 by FDA under an Emergency Use Authorization (EUA). This EUA will remain in effect (meaning this test can be used) for the duration of the COVID-19 declaration under  Section 564(b)(1) of the Act, 21 U.S.C. section 360bbb-3(b)(1), unless the authorization is terminated or revoked.  Performed at [facility], [address] [place], [place] [address]   Comprehensive metabolic panel     Status: Abnormal   Collection Time: 10/31/20  5:15 AM  Result Value Ref Range   Sodium 135 135 - 145 mmol/L   Potassium 4.2 3.5 - 5.1 mmol/L   Chloride 103 98 - 111 mmol/L   CO2 24 22 - 32 mmol/L   Glucose, Bld 129 (H) 70 - 99 mg/dL    Comment: Glucose reference range applies only to samples taken after fasting for at least 8 hours.   BUN 13 8 - 23 mg/dL   Creatinine, Ser 1.01 0.61 - 1.24 mg/dL   Calcium 8.7 (L) 8.9 - 10.3 mg/dL   Total Protein 6.7 6.5 - 8.1 g/dL   Albumin 3.6 3.5 - 5.0 g/dL   AST 15 15 - 41 U/L   ALT 11 0 - 44 U/L   Alkaline Phosphatase 50 38 - 126 U/L   Total Bilirubin 1.1 0.3 - 1.2 mg/dL   GFR, Estimated >60 >60 mL/min    Comment: (NOTE) Calculated using the CKD-EPI Creatinine Equation (2021)    Anion gap 8 5 - 15    Comment: Performed at [facility], [address] [place], [place] [address]  CBC     Status: None   Collection Time: 10/31/20  5:15 AM  Result Value Ref Range   WBC 6.4 4.0 - 10.5 K/uL   RBC 4.59 4.22 - 5.81 MIL/uL   Hemoglobin 14.0 13.0 - 17.0 g/dL   HCT 41.7 39.0 - 52.0 %   MCV 90.8 80.0 - 100.0 fL   MCH 30.5 26.0 - 34.0 pg   MCHC 33.6 30.0 - 36.0 g/dL   RDW 12.7 11.5 - 15.5 %   Platelets 211 150 - 400 K/uL   nRBC 0.0 0.0 - 0.2  %    Comment: Performed at [facility], [address] [place], [place] [address]  Protime-INR     Status: None   Collection Time: 10/31/20  5:15 AM  Result Value Ref Range   Prothrombin Time 14.3 11.4 - 15.2 seconds   INR 1.1 0.8 - 1.2    Comment: (NOTE) INR goal varies based on device and disease states. Performed at [facility], [address] [place], [place] [address]   APTT     Status: None   Collection Time: 10/31/20  5:15 AM  Result Value Ref Range   aPTT 28 24 - 36 seconds    Comment: Performed at [facility], [address], [place], [place] [address]  Magnesium     Status: None   Collection Time: 10/31/20  5:15 AM  Result Value Ref Range   Magnesium 1.8 1.7 - 2.4 mg/dL    Comment: Performed at [facility], [address], [place], [place] [address]  Phosphorus     Status: None   Collection Time: 10/31/20  5:15 AM  Result Value Ref Range   Phosphorus 3.2 2.5 - 4.6 mg/dL    Comment: Performed at [facility], [address] [place], [place] [address]    CT Abdomen Pelvis W Contrast  Result Date: 10/30/2020 CLINICAL DATA:  Right lower quadrant abdominal pain starting last night. EXAM: CT ABDOMEN AND PELVIS WITH CONTRAST TECHNIQUE: Multidetector CT imaging of the abdomen and pelvis was performed using the standard protocol following bolus administration of intravenous contrast. CONTRAST:  159mL OMNIPAQUE  IOHEXOL 300 MG/ML  SOLN COMPARISON:  None. FINDINGS: Lower chest: The lung bases are clear. Hepatobiliary: Mild diffuse fatty infiltration of the liver. Focal lesion in segment 7 measuring 2 cm diameter. There is peripheral nodular enhancement early, consistent with a benign cavernous hemangioma. Gallbladder and bile ducts are unremarkable. Pancreas: Unremarkable. No pancreatic ductal dilatation or surrounding inflammatory changes. Spleen: Normal in size without focal abnormality. Adrenals/Urinary Tract: Adrenal glands are unremarkable. Kidneys are normal, without renal  calculi, focal lesion, or hydronephrosis. Bladder is unremarkable. Stomach/Bowel: The stomach, small bowel, and colon are not abnormally distended. Scattered colonic diverticula without evidence of diverticulitis. The appendix is dilated, with appendiceal diameter measuring 1.5 cm. Periappendiceal stranding and edema consistent with acute appendicitis. Appendix: Location: Right lower quadrant retrocecal. Diameter: 15 mm Appendicolith: No Mucosal hyper-enhancement: Yes Extraluminal gas: No Periappendiceal collection: No Vascular/Lymphatic: Aortic atherosclerosis. No enlarged abdominal or pelvic lymph nodes. Reproductive: Prostate gland is enlarged, measuring 5.2 cm diameter. Other: No free air or free fluid in the abdomen. Abdominal wall musculature appears intact. Hazy mesenteric stranding likely representing inflammatory change such as mesenteritis. Musculoskeletal: Mild degenerative changes in the spine and hips. IMPRESSION: 1. Acute appendicitis. No abscess. 2. Mild diffuse fatty infiltration of the liver. 3. 2 cm diameter benign cavernous hemangioma in segment 7 of the liver. 4. Enlarged prostate gland. 5. Aortic atherosclerosis. 6. Hazy mesenteric stranding likely representing inflammatory change such as mesenteritis. Aortic Atherosclerosis (ICD10-I70.0). Electronically Signed   By: Lucienne Capers M.D.   On: 10/30/2020 21:40    ROS:  Pertinent items are noted in HPI.  Blood pressure 132/73, pulse 93, temperature 98.8 F (37.1 C), temperature source Oral, resp. rate 18, height 5\' 7"  (1.702 m), weight 81.5 kg, SpO2 95 %. Physical Exam: Pleasant white male no acute distress Head is normocephalic, atraumatic Lungs clear to auscultation with good breath sounds bilaterally Heart examination reveals regular rate and rhythm without S3, S4, murmurs Abdomen is rotund but soft.  Tenderness is noted in the right lower quadrant to palpation.  No rigidity is noted.  CT scan images personally  reviewed  Assessment/Plan: Impression: Acute appendicitis Plan: Patient will be taken to the operating room shortly for laparoscopic appendectomy.  The risks and benefits of the procedure including bleeding, infection, and the possibility of an open procedure were fully explained to the patient, who gave informed consent.  Aviva Signs 10/31/2020, 7:26 AM

## 2020-10-31 NOTE — Transfer of Care (Signed)
Immediate Anesthesia Transfer of Care Note  Patient: Arthur Moreno  Procedure(s) Performed: APPENDECTOMY LAPAROSCOPIC (N/A Abdomen)  Patient Location: PACU  Anesthesia Type:General  Level of Consciousness: awake and alert   Airway & Oxygen Therapy: Patient Spontanous Breathing and aerosol face mask  Post-op Assessment: Report given to RN and Post -op Vital signs reviewed and stable  Post vital signs: Reviewed and stable  Last Vitals:  Vitals Value Taken Time  BP    Temp    Pulse    Resp    SpO2      Last Pain:  Vitals:   10/31/20 0619  TempSrc: Oral  PainSc:          Complications: No complications documented.

## 2020-10-31 NOTE — Care Management CC44 (Signed)
Condition Code 44 Documentation Completed  Patient Details  Name: Arthur Moreno MRN: [id number] Date of Birth: 12/16/1940   Condition Code 44 given:  Yes Patient signature on Condition Code 44 notice:    Documentation of 2 MD's agreement:  Yes Code 44 added to claim:  Yes   Patient in PACU with dc order. CSW is working remote unable to reach patient. CSW called patients wife with no answer. Letter provided with discharge paperwork.  Trenese Haft A Adabelle Griffiths, LCSW 10/31/2020, 10:18 AM

## 2020-10-31 NOTE — Anesthesia Preprocedure Evaluation (Signed)
Anesthesia Evaluation  Patient identified by MRN, date of birth, ID band Patient awake    Reviewed: Allergy & Precautions, H&P , NPO status , Patient's Chart, lab work & pertinent test results, reviewed documented beta blocker date and time   Airway Mallampati: II  TM Distance: >3 FB Neck ROM: full    Dental no notable dental hx.    Pulmonary neg pulmonary ROS, former smoker,    Pulmonary exam normal breath sounds clear to auscultation       Cardiovascular Exercise Tolerance: Good hypertension, negative cardio ROS   Rhythm:regular Rate:Normal     Neuro/Psych negative neurological ROS  negative psych ROS   GI/Hepatic Neg liver ROS, GERD  Medicated,  Endo/Other  negative endocrine ROS  Renal/GU negative Renal ROS  negative genitourinary   Musculoskeletal   Abdominal   Peds  Hematology negative hematology ROS (+)   Anesthesia Other Findings   Reproductive/Obstetrics negative OB ROS                             Anesthesia Physical Anesthesia Plan  ASA: II  Anesthesia Plan: General   Post-op Pain Management:    Induction:   PONV Risk Score and Plan: Ondansetron  Airway Management Planned:   Additional Equipment:   Intra-op Plan:   Post-operative Plan:   Informed Consent: I have reviewed the patients History and Physical, chart, labs and discussed the procedure including the risks, benefits and alternatives for the proposed anesthesia with the patient or authorized representative who has indicated his/her understanding and acceptance.     Dental Advisory Given  Plan Discussed with: CRNA  Anesthesia Plan Comments:         Anesthesia Quick Evaluation

## 2020-10-31 NOTE — Progress Notes (Signed)
  Discussed with general surgeon Dr. Arnoldo Morale -Dr. Arnoldo Morale transferred patient to his surgical service -Patient is status post appendectomy on 10/31/2020 -Dr. Arnoldo Morale plans to discharge him home on 10/31/2020 if he tolerates oral intake well and remained stable  -As per Dr. Arnoldo Morale no need for further hospitalization input at this time -Hospitalist service will sign off at this time -Please recall hospitalist service if needed -Plan discussed with patient and his wife at bedside  Roxan Hockey, MD

## 2020-10-31 NOTE — Anesthesia Procedure Notes (Signed)
Procedure Name: Intubation Date/Time: 10/31/2020 8:18 AM Performed by: Louann Sjogren, MD Pre-anesthesia Checklist: Patient identified, Emergency Drugs available, Suction available, Patient being monitored and Timeout performed Patient Re-evaluated:Patient Re-evaluated prior to induction Oxygen Delivery Method: Circle system utilized Preoxygenation: Pre-oxygenation with 100% oxygen Induction Type: IV induction Laryngoscope Size: Glidescope and 3 Grade View: Grade I Tube type: Oral Tube size: 7.0 mm Number of attempts: 1 Airway Equipment and Method: Video-laryngoscopy and Stylet Placement Confirmation: ETT inserted through vocal cords under direct vision,  positive ETCO2 and breath sounds checked- equal and bilateral Secured at: 22 cm Tube secured with: Tape Dental Injury: Teeth and Oropharynx as per pre-operative assessment

## 2020-10-31 NOTE — Op Note (Signed)
Patient:  Arthur Moreno  DOB:  08-Jan-1941  MRN:  [id number]   Preop Diagnosis: Acute appendicitis  Postop Diagnosis: Same  Procedure: Laparoscopic appendectomy  Surgeon: Aviva Signs, MD  Anes: General endotracheal  Indications: Patient is a 80 year old white male who presents from emergency room with a 24-hour history of worsening right lower quadrant abdominal pain.  CT scan of the abdomen revealed acute appendicitis.  The patient now comes to the operating room for laparoscopic appendectomy.  The risks and benefits of the procedure including bleeding, infection, and the possibility of an open procedure were fully explained to the patient, who gave informed consent.  Procedure note: The patient was placed in the supine position.  After induction of general endotracheal anesthesia, the abdomen was prepped and draped using the usual sterile technique with ChloraPrep.  Surgical site confirmation was performed.  A supraumbilical incision was made down to the fascia.  Veress needle was introduced into the abdominal cavity and confirmation of placement was done using the saline drop test.  The abdomen was then insufflated to 15 mmHg pressure.  An 11 mm trocar was introduced into the abdominal cavity under direct visualization without difficulty.  The patient was placed in deeper Trendelenburg position and an additional 12 mm trocar was placed in the suprapubic region and a 5 mm trocar was placed in the left lower quadrant region.  The appendix was visualized and the distal two thirds was noted to be inflamed.  The mesoappendix was divided using the harmonic scalpel.  A vascular Endo GIA was placed across the base of the appendix at its junction to the cecum and fired.  The appendix was then removed using an Endo Catch bag without difficulty.  Any bleeding was controlled using harmonic scalpel.  Arista and Surgicel were placed along the appendiceal mesentery.  All fluid and air were then evacuated from  the abdominal cavity prior to removal of the trochars.  All wounds were irrigated with normal saline.  All wounds were injected with Exparel.  All incisions were closed using a 4-0 Monocryl subcuticular suture.  Dermabond was applied.  All tape and needle counts were correct at the end of the procedure.  The patient was extubated in the operating room and transferred to PACU in stable condition.  Complications: None  EBL: Minimal  Specimen: Appendix

## 2020-10-31 NOTE — H&P (View-Only) (Signed)
Reason for Consult: Right lower quadrant abdominal pain Referring Physician: Dr. Evern Bio is an 80 y.o. male.  HPI: Patient is a 80 year old white male who presented to the emergency room with a 24-hour history of worsening right lower quadrant abdominal pain.  CT scan of the abdomen revealed acute appendicitis.  Patient states he has a decreased appetite.  He denies any fever or chills.  Past Medical History:  Diagnosis Date  . Acid reflux   . Hypertension   . Low back pain   . Osteoarthritis     Past Surgical History:  Procedure Laterality Date  . HEMORRHOID SURGERY     At the age of 43yrs    No family history on file.  Social History:  reports that he quit smoking about 9 years ago. He has never used smokeless tobacco. He reports previous alcohol use. He reports that he does not use drugs.  Allergies: Not on File  Medications: I have reviewed the patient's current medications.  Results for orders placed or performed during the hospital encounter of 10/30/20 (from the past 48 hour(s))  CBC with Differential     Status: Abnormal   Collection Time: 10/30/20  7:45 PM  Result Value Ref Range   WBC 4.8 4.0 - 10.5 K/uL   RBC 4.90 4.22 - 5.81 MIL/uL   Hemoglobin 14.9 13.0 - 17.0 g/dL   HCT 44.3 39.0 - 52.0 %   MCV 90.4 80.0 - 100.0 fL   MCH 30.4 26.0 - 34.0 pg   MCHC 33.6 30.0 - 36.0 g/dL   RDW 12.6 11.5 - 15.5 %   Platelets 236 150 - 400 K/uL   nRBC 0.0 0.0 - 0.2 %   Neutrophils Relative % 66 %   Neutro Abs 3.2 1.7 - 7.7 K/uL   Lymphocytes Relative 13 %   Lymphs Abs 0.6 (L) 0.7 - 4.0 K/uL   Monocytes Relative 17 %   Monocytes Absolute 0.8 0.1 - 1.0 K/uL   Eosinophils Relative 3 %   Eosinophils Absolute 0.1 0.0 - 0.5 K/uL   Basophils Relative 1 %   Basophils Absolute 0.0 0.0 - 0.1 K/uL   Immature Granulocytes 0 %   Abs Immature Granulocytes 0.01 0.00 - 0.07 K/uL    Comment: Performed at [facility], [address], [place], [place] [address]   Comprehensive metabolic panel     Status: Abnormal   Collection Time: 10/30/20  7:45 PM  Result Value Ref Range   Sodium 134 (L) 135 - 145 mmol/L   Potassium 3.9 3.5 - 5.1 mmol/L   Chloride 100 98 - 111 mmol/L   CO2 25 22 - 32 mmol/L   Glucose, Bld 114 (H) 70 - 99 mg/dL    Comment: Glucose reference range applies only to samples taken after fasting for at least 8 hours.   BUN 13 8 - 23 mg/dL   Creatinine, Ser 1.13 0.61 - 1.24 mg/dL   Calcium 9.0 8.9 - 10.3 mg/dL   Total Protein 7.6 6.5 - 8.1 g/dL   Albumin 4.3 3.5 - 5.0 g/dL   AST 19 15 - 41 U/L   ALT 11 0 - 44 U/L   Alkaline Phosphatase 61 38 - 126 U/L   Total Bilirubin 0.9 0.3 - 1.2 mg/dL   GFR, Estimated >60 >60 mL/min    Comment: (NOTE) Calculated using the CKD-EPI Creatinine Equation (2021)    Anion gap 9 5 - 15    Comment: Performed at [facility],  [address] [place], [place] [address]  Lipase, blood     Status: None   Collection Time: 10/30/20  7:45 PM  Result Value Ref Range   Lipase 30 11 - 51 U/L    Comment: Performed at [facility], [address] [place], [place] [address]  Urinalysis, Routine w reflex microscopic Urine, Clean Catch     Status: Abnormal   Collection Time: 10/30/20  8:25 PM  Result Value Ref Range   Color, Urine YELLOW YELLOW   APPearance CLEAR CLEAR   Specific Gravity, Urine 1.027 1.005 - 1.030   pH 5.0 5.0 - 8.0   Glucose, UA NEGATIVE NEGATIVE mg/dL   Hgb urine dipstick NEGATIVE NEGATIVE   Bilirubin Urine NEGATIVE NEGATIVE   Ketones, ur 5 (A) NEGATIVE mg/dL   Protein, ur 30 (A) NEGATIVE mg/dL   Nitrite NEGATIVE NEGATIVE   Leukocytes,Ua NEGATIVE NEGATIVE   RBC / HPF 0-5 0 - 5 RBC/hpf   WBC, UA 0-5 0 - 5 WBC/hpf   Bacteria, UA NONE SEEN NONE SEEN   Squamous Epithelial / LPF 0-5 0 - 5   Mucus PRESENT     Comment: Performed at [facility], [address], [place], [place] [address]  Resp Panel by RT-PCR (Flu A&B, Covid) Nasopharyngeal Swab     Status: None   Collection Time: 10/30/20  10:30 PM   Specimen: Nasopharyngeal Swab; Nasopharyngeal(NP) swabs in vial transport medium  Result Value Ref Range   SARS Coronavirus 2 by RT PCR NEGATIVE NEGATIVE    Comment: (NOTE) SARS-CoV-2 target nucleic acids are NOT DETECTED.  The SARS-CoV-2 RNA is generally detectable in upper respiratory specimens during the acute phase of infection. The lowest concentration of SARS-CoV-2 viral copies this assay can detect is 138 copies/mL. A negative result does not preclude SARS-Cov-2 infection and should not be used as the sole basis for treatment or other patient management decisions. A negative result may occur with  improper specimen collection/handling, submission of specimen other than nasopharyngeal swab, presence of viral mutation(s) within the areas targeted by this assay, and inadequate number of viral copies(<138 copies/mL). A negative result must be combined with clinical observations, patient history, and epidemiological information. The expected result is Negative.  Fact Sheet for Patients:  EntrepreneurPulse.com.au  Fact Sheet for Healthcare Providers:  IncredibleEmployment.be  This test is no t yet approved or cleared by the [place] FDA and  has been authorized for detection and/or diagnosis of SARS-CoV-2 by FDA under an Emergency Use Authorization (EUA). This EUA will remain  in effect (meaning this test can be used) for the duration of the COVID-19 declaration under Section 564(b)(1) of the Act, 21 U.S.C.section 360bbb-3(b)(1), unless the authorization is terminated  or revoked sooner.       Influenza A by PCR NEGATIVE NEGATIVE   Influenza B by PCR NEGATIVE NEGATIVE    Comment: (NOTE) The Xpert Xpress SARS-CoV-2/FLU/RSV plus assay is intended as an aid in the diagnosis of influenza from Nasopharyngeal swab specimens and should not be used as a sole basis for treatment. Nasal washings and aspirates are unacceptable for  Xpert Xpress SARS-CoV-2/FLU/RSV testing.  Fact Sheet for Patients: EntrepreneurPulse.com.au  Fact Sheet for Healthcare Providers: IncredibleEmployment.be  This test is not yet approved or cleared by the [place] FDA and has been authorized for detection and/or diagnosis of SARS-CoV-2 by FDA under an Emergency Use Authorization (EUA). This EUA will remain in effect (meaning this test can be used) for the duration of the COVID-19 declaration under  Section 564(b)(1) of the Act, 21 U.S.C. section 360bbb-3(b)(1), unless the authorization is terminated or revoked.  Performed at [facility], [address] [place], [place] [address]   Comprehensive metabolic panel     Status: Abnormal   Collection Time: 10/31/20  5:15 AM  Result Value Ref Range   Sodium 135 135 - 145 mmol/L   Potassium 4.2 3.5 - 5.1 mmol/L   Chloride 103 98 - 111 mmol/L   CO2 24 22 - 32 mmol/L   Glucose, Bld 129 (H) 70 - 99 mg/dL    Comment: Glucose reference range applies only to samples taken after fasting for at least 8 hours.   BUN 13 8 - 23 mg/dL   Creatinine, Ser 1.01 0.61 - 1.24 mg/dL   Calcium 8.7 (L) 8.9 - 10.3 mg/dL   Total Protein 6.7 6.5 - 8.1 g/dL   Albumin 3.6 3.5 - 5.0 g/dL   AST 15 15 - 41 U/L   ALT 11 0 - 44 U/L   Alkaline Phosphatase 50 38 - 126 U/L   Total Bilirubin 1.1 0.3 - 1.2 mg/dL   GFR, Estimated >60 >60 mL/min    Comment: (NOTE) Calculated using the CKD-EPI Creatinine Equation (2021)    Anion gap 8 5 - 15    Comment: Performed at [facility], [address] [place], [place] [address]  CBC     Status: None   Collection Time: 10/31/20  5:15 AM  Result Value Ref Range   WBC 6.4 4.0 - 10.5 K/uL   RBC 4.59 4.22 - 5.81 MIL/uL   Hemoglobin 14.0 13.0 - 17.0 g/dL   HCT 41.7 39.0 - 52.0 %   MCV 90.8 80.0 - 100.0 fL   MCH 30.5 26.0 - 34.0 pg   MCHC 33.6 30.0 - 36.0 g/dL   RDW 12.7 11.5 - 15.5 %   Platelets 211 150 - 400 K/uL   nRBC 0.0 0.0 - 0.2  %    Comment: Performed at [facility], [address] [place], [place] [address]  Protime-INR     Status: None   Collection Time: 10/31/20  5:15 AM  Result Value Ref Range   Prothrombin Time 14.3 11.4 - 15.2 seconds   INR 1.1 0.8 - 1.2    Comment: (NOTE) INR goal varies based on device and disease states. Performed at [facility], [address] [place], [place] [address]   APTT     Status: None   Collection Time: 10/31/20  5:15 AM  Result Value Ref Range   aPTT 28 24 - 36 seconds    Comment: Performed at [facility], [address], [place], [place] [address]  Magnesium     Status: None   Collection Time: 10/31/20  5:15 AM  Result Value Ref Range   Magnesium 1.8 1.7 - 2.4 mg/dL    Comment: Performed at [facility], [address], [place], [place] [address]  Phosphorus     Status: None   Collection Time: 10/31/20  5:15 AM  Result Value Ref Range   Phosphorus 3.2 2.5 - 4.6 mg/dL    Comment: Performed at [facility], [address] [place], [place] [address]    CT Abdomen Pelvis W Contrast  Result Date: 10/30/2020 CLINICAL DATA:  Right lower quadrant abdominal pain starting last night. EXAM: CT ABDOMEN AND PELVIS WITH CONTRAST TECHNIQUE: Multidetector CT imaging of the abdomen and pelvis was performed using the standard protocol following bolus administration of intravenous contrast. CONTRAST:  159mL OMNIPAQUE  IOHEXOL 300 MG/ML  SOLN COMPARISON:  None. FINDINGS: Lower chest: The lung bases are clear. Hepatobiliary: Mild diffuse fatty infiltration of the liver. Focal lesion in segment 7 measuring 2 cm diameter. There is peripheral nodular enhancement early, consistent with a benign cavernous hemangioma. Gallbladder and bile ducts are unremarkable. Pancreas: Unremarkable. No pancreatic ductal dilatation or surrounding inflammatory changes. Spleen: Normal in size without focal abnormality. Adrenals/Urinary Tract: Adrenal glands are unremarkable. Kidneys are normal, without renal  calculi, focal lesion, or hydronephrosis. Bladder is unremarkable. Stomach/Bowel: The stomach, small bowel, and colon are not abnormally distended. Scattered colonic diverticula without evidence of diverticulitis. The appendix is dilated, with appendiceal diameter measuring 1.5 cm. Periappendiceal stranding and edema consistent with acute appendicitis. Appendix: Location: Right lower quadrant retrocecal. Diameter: 15 mm Appendicolith: No Mucosal hyper-enhancement: Yes Extraluminal gas: No Periappendiceal collection: No Vascular/Lymphatic: Aortic atherosclerosis. No enlarged abdominal or pelvic lymph nodes. Reproductive: Prostate gland is enlarged, measuring 5.2 cm diameter. Other: No free air or free fluid in the abdomen. Abdominal wall musculature appears intact. Hazy mesenteric stranding likely representing inflammatory change such as mesenteritis. Musculoskeletal: Mild degenerative changes in the spine and hips. IMPRESSION: 1. Acute appendicitis. No abscess. 2. Mild diffuse fatty infiltration of the liver. 3. 2 cm diameter benign cavernous hemangioma in segment 7 of the liver. 4. Enlarged prostate gland. 5. Aortic atherosclerosis. 6. Hazy mesenteric stranding likely representing inflammatory change such as mesenteritis. Aortic Atherosclerosis (ICD10-I70.0). Electronically Signed   By: Lucienne Capers M.D.   On: 10/30/2020 21:40    ROS:  Pertinent items are noted in HPI.  Blood pressure 132/73, pulse 93, temperature 98.8 F (37.1 C), temperature source Oral, resp. rate 18, height 5\' 7"  (1.702 m), weight 81.5 kg, SpO2 95 %. Physical Exam: Pleasant white male no acute distress Head is normocephalic, atraumatic Lungs clear to auscultation with good breath sounds bilaterally Heart examination reveals regular rate and rhythm without S3, S4, murmurs Abdomen is rotund but soft.  Tenderness is noted in the right lower quadrant to palpation.  No rigidity is noted.  CT scan images personally  reviewed  Assessment/Plan: Impression: Acute appendicitis Plan: Patient will be taken to the operating room shortly for laparoscopic appendectomy.  The risks and benefits of the procedure including bleeding, infection, and the possibility of an open procedure were fully explained to the patient, who gave informed consent.  Aviva Signs 10/31/2020, 7:26 AM

## 2020-10-31 NOTE — Interval H&P Note (Signed)
History and Physical Interval Note:  10/31/2020 7:58 AM  Arthur Moreno  has presented today for surgery, with the diagnosis of acute appendicitis.  The various methods of treatment have been discussed with the patient and family. After consideration of risks, benefits and other options for treatment, the patient has consented to  Procedure(s): APPENDECTOMY LAPAROSCOPIC (N/A) as a surgical intervention.  The patient's history has been reviewed, patient examined, no change in status, stable for surgery.  I have reviewed the patient's chart and labs.  Questions were answered to the patient's satisfaction.     Aviva Signs

## 2020-10-31 NOTE — OR Nursing (Signed)
Patient belongs sent to OR 3 in a patient belongings bag with the patient and a patient label and sent with the patient to PACU.

## 2020-11-01 ENCOUNTER — Encounter (HOSPITAL_COMMUNITY): Payer: Self-pay | Admitting: General Surgery

## 2020-11-01 NOTE — Discharge Summary (Signed)
Physician Discharge Summary  Patient ID: Arthur Moreno MRN: [id number] DOB/AGE: Sep 27, 1940 80 y.o.  Admit date: 10/30/2020 Discharge date: 10/31/2020 Admission Diagnoses: Acute appendicitis  Discharge Diagnoses: Same Principal Problem:   Acute appendicitis Active Problems:   Abdominal pain   Cavernous hemangioma   Essential hypertension   Hyperlipidemia   GERD (gastroesophageal reflux disease)   Osteoarthritis   Hyponatremia   Appendicitis, acute   Discharged Condition: good  Hospital Course: Patient is a 80 year old white male who presented to the emergency room on 10/30/2020 with a 24-hour history of worsening right lower quadrant abdominal pain.  CT scan of the abdomen revealed acute appendicitis.  The patient was taken to the operating room on 10/31/2020 and underwent laparoscopic appendectomy.  He tolerated the procedure well.  His postoperative course was unremarkable.  His diet was advanced without difficulty.  The patient was discharged home on 10/31/2020 in good and improving condition.  Treatments: surgery: Laparoscopic appendectomy on 10/31/2020  Discharge Exam: Blood pressure (!) 158/93, pulse (!) 105, temperature 99.7 F (37.6 C), temperature source Oral, resp. rate 18, height 5\' 7"  (1.702 m), weight 81.5 kg, SpO2 92 %. General appearance: alert, cooperative and no distress Resp: clear to auscultation bilaterally Cardio: regular rate and rhythm, S1, S2 normal, no murmur, click, rub or gallop GI: Soft, incisions healing well.  Disposition: Discharge disposition: 01-Home or Self Care       Discharge Instructions    Diet - low sodium heart healthy   Complete by: As directed    Increase activity slowly   Complete by: As directed      Allergies as of 10/31/2020   Not on File     Medication List    TAKE these medications   AMLODIPINE BESYLATE PO Take 10 mg by mouth daily.   ATORVASTATIN CALCIUM PO Take 20 mg by mouth.   fluticasone 50 MCG/ACT nasal  spray Commonly known as: FLONASE Place 1 spray into both nostrils daily.   FLUTICASONE PROPIONATE EX Apply topically.   HYDROcodone-acetaminophen 5-325 MG tablet Commonly known as: Norco Take 1 tablet by mouth every 6 (six) hours as needed for moderate pain.   ibuprofen 200 MG tablet Commonly known as: ADVIL Take 200 mg by mouth every 6 (six) hours as needed.   LISINOPRIL PO Take 40 mg by mouth daily.   loratadine 10 MG tablet Commonly known as: CLARITIN Take 10 mg by mouth daily.   PRILOSEC PO Take 20 mg by mouth daily.   QC TUMERIC COMPLEX PO Take 1 tablet by mouth daily.   VITAMIN B1-B12 IM Inject into the muscle See admin instructions. 1 injections IM every 2 months.       Follow-up Information    Aviva Signs, MD Follow up.   Specialty: General Surgery Why: Will call you next week for follow up Contact information: [address] [address] [phone]               Signed: Aviva Signs 11/01/2020, 11:22 AM

## 2020-11-02 LAB — SURGICAL PATHOLOGY

## 2020-11-04 ENCOUNTER — Telehealth (INDEPENDENT_AMBULATORY_CARE_PROVIDER_SITE_OTHER): Payer: Medicare PPO | Admitting: General Surgery

## 2020-11-04 DIAGNOSIS — Z09 Encounter for follow-up examination after completed treatment for conditions other than malignant neoplasm: Secondary | ICD-10-CM

## 2020-11-04 NOTE — Telephone Encounter (Signed)
Postoperative telephone visit performed.  Patient was constipated after the surgery, but did have a bowel movement yesterday evening and this morning.  He does feel better.  He does have incisional pain.  He stopped his hydrocodone due to constipation and switch to Tylenol.  He denies any fever or chills.  I told him to try ibuprofen 2 tablets every 8 hours as needed for pain.  I told him to call me back should he have any further issues.  Final pathology did reveal acute appendicitis.  As this was a part of the global surgical fee, no billable service was performed.

## 2021-04-15 ENCOUNTER — Encounter (HOSPITAL_COMMUNITY): Payer: Medicare PPO

## 2021-04-19 NOTE — Patient Instructions (Signed)
DUE TO COVID-19 ONLY ONE VISITOR IS ALLOWED TO COME WITH YOU AND STAY IN THE WAITING ROOM ONLY DURING PRE OP AND PROCEDURE.   **NO VISITORS ARE ALLOWED IN THE SHORT STAY AREA OR RECOVERY ROOM!!**  IF YOU WILL BE ADMITTED INTO THE HOSPITAL YOU ARE ALLOWED ONLY TWO SUPPORT PEOPLE DURING VISITATION HOURS ONLY (10AM -8PM)   The support person(s) may change daily. The support person(s) must pass our screening, gel in and out, and wear a mask at all times, including in the patient's room. Patients must also wear a mask when staff or their support person are in the room.  No visitors under the age of 54. Any visitor under the age of 70 must be accompanied by an adult.    COVID SWAB TESTING MUST BE COMPLETED ON:  04/25/21 **MUST PRESENT COMPLETED FORM AT TESTING SITE**    [place] (backside of the building) You are not required to quarantine, however you are required to wear a well-fitted mask when you are out and around people not in your household.  Hand Hygiene often Do NOT share personal items Notify your provider if you are in close contact with someone who has COVID or you develop fever 100.4 or greater, new onset of sneezing, cough, sore throat, shortness of breath or body aches.  [address] [place], [address], must go inside of the hospital, NOT A DRIVE THRU!  (Must self quarantine after testing. Follow instructions on handout.)       Your procedure is scheduled on: 04/27/21   Report to [facility] Main Entrance    Report to admitting at: 10:00 AM   Call this number if you have problems the morning of surgery [phone]   Do not eat food :After Midnight.   May have liquids until: 9:40 AM    day of surgery  CLEAR LIQUID DIET  Foods Allowed                                                                     Foods Excluded  Water, Black Coffee and tea, regular and decaf                              liquids that you cannot  Plain Jell-O in any flavor  (No red)                                           see through such as: Fruit ices (not with fruit pulp)                                     milk, soups, orange juice              Iced Popsicles (No red)  All solid food                                   Apple juices Sports drinks like Gatorade (No red) Lightly seasoned clear broth or consume(fat free) Sugar,   Sample Menu Breakfast                                Lunch                                     Supper Cranberry juice                    Beef broth                            Chicken broth Jell-O                                     Grape juice                           Apple juice Coffee or tea                        Jell-O                                      Popsicle                                                Coffee or tea                        Coffee or tea      Complete one Ensure drink the morning of surgery at : 9:40 AM      the day of surgery.    The day of surgery:  Drink ONE (1) Pre-Surgery Clear Ensure or G2 by am the morning of surgery. Drink in one sitting. Do not sip.  This drink was given to you during your hospital  pre-op appointment visit. Nothing else to drink after completing the  Pre-Surgery Clear Ensure or G2.          If you have questions, please contact your surgeon's office.     Oral Hygiene is also important to reduce your risk of infection.                                    Remember - BRUSH YOUR TEETH THE MORNING OF SURGERY WITH YOUR REGULAR TOOTHPASTE   Do NOT smoke after Midnight   Take these medicines the morning of surgery with A SIP OF WATER: loratadine,amlodipine,omeprazole.  DO NOT TAKE ANY ORAL DIABETIC MEDICATIONS DAY OF YOUR SURGERY  You may not have any metal on your body including hair pins, jewelry, and body piercing             Do not wear lotions,  powders, perfumes/cologne, or deodorant              Men may shave face and neck.   Do not bring valuables to the hospital. [place].   Contacts, dentures or bridgework may not be worn into surgery.   Bring small overnight bag day of surgery.    Patients discharged on the day of surgery will not be allowed to drive home.   Special Instructions: Bring a copy of your healthcare power of attorney and living will documents         the day of surgery if you haven't scanned them before.              Please read over the following fact sheets you were given: IF YOU HAVE QUESTIONS ABOUT YOUR PRE-OP INSTRUCTIONS PLEASE CALL [phone]   [facility] - Preparing for Surgery Before surgery, you can play an important role.  Because skin is not sterile, your skin needs to be as free of germs as possible.  You can reduce the number of germs on your skin by washing with CHG (chlorahexidine gluconate) soap before surgery.  CHG is an antiseptic cleaner which kills germs and bonds with the skin to continue killing germs even after washing. Please DO NOT use if you have an allergy to CHG or antibacterial soaps.  If your skin becomes reddened/irritated stop using the CHG and inform your nurse when you arrive at Short Stay. Do not shave (including legs and underarms) for at least 48 hours prior to the first CHG shower.  You may shave your face/neck. Please follow these instructions carefully:  1.  Shower with CHG Soap the night before surgery and the  morning of Surgery.  2.  If you choose to wash your hair, wash your hair first as usual with your  normal  shampoo.  3.  After you shampoo, rinse your hair and body thoroughly to remove the  shampoo.                           4.  Use CHG as you would any other liquid soap.  You can apply chg directly  to the skin and wash                       Gently with a scrungie or clean washcloth.  5.  Apply the CHG Soap to  your body ONLY FROM THE NECK DOWN.   Do not use on face/ open                           Wound or open sores. Avoid contact with eyes, ears mouth and genitals (private parts).                       Wash face,  Genitals (private parts) with your normal soap.             6.  Wash thoroughly, paying special attention to the area where your surgery  will be performed.  7.  Thoroughly rinse your body with warm water from  the neck down.  8.  DO NOT shower/wash with your normal soap after using and rinsing off  the CHG Soap.                9.  Pat yourself dry with a clean towel.            10.  Wear clean pajamas.            11.  Place clean sheets on your bed the night of your first shower and do not  sleep with pets. Day of Surgery : Do not apply any lotions/deodorants the morning of surgery.  Please wear clean clothes to the hospital/surgery center.  FAILURE TO FOLLOW THESE INSTRUCTIONS MAY RESULT IN THE CANCELLATION OF YOUR SURGERY PATIENT SIGNATURE_________________________________  NURSE SIGNATURE__________________________________  ________________________________________________________________________   Adam Phenix  An incentive spirometer is a tool that can help keep your lungs clear and active. This tool measures how well you are filling your lungs with each breath. Taking long deep breaths may help reverse or decrease the chance of developing breathing (pulmonary) problems (especially infection) following: A long period of time when you are unable to move or be active. BEFORE THE PROCEDURE  If the spirometer includes an indicator to show your best effort, your nurse or respiratory therapist will set it to a desired goal. If possible, sit up straight or lean slightly forward. Try not to slouch. Hold the incentive spirometer in an upright position. INSTRUCTIONS FOR USE  Sit on the edge of your bed if possible, or sit up as far as you can in bed or on a chair. Hold the incentive  spirometer in an upright position. Breathe out normally. Place the mouthpiece in your mouth and seal your lips tightly around it. Breathe in slowly and as deeply as possible, raising the piston or the ball toward the top of the column. Hold your breath for 3-5 seconds or for as long as possible. Allow the piston or ball to fall to the bottom of the column. Remove the mouthpiece from your mouth and breathe out normally. Rest for a few seconds and repeat Steps 1 through 7 at least 10 times every 1-2 hours when you are awake. Take your time and take a few normal breaths between deep breaths. The spirometer may include an indicator to show your best effort. Use the indicator as a goal to work toward during each repetition. After each set of 10 deep breaths, practice coughing to be sure your lungs are clear. If you have an incision (the cut made at the time of surgery), support your incision when coughing by placing a pillow or rolled up towels firmly against it. Once you are able to get out of bed, walk around indoors and cough well. You may stop using the incentive spirometer when instructed by your caregiver.  RISKS AND COMPLICATIONS Take your time so you do not get dizzy or light-headed. If you are in pain, you may need to take or ask for pain medication before doing incentive spirometry. It is harder to take a deep breath if you are having pain. AFTER USE Rest and breathe slowly and easily. It can be helpful to keep track of a log of your progress. Your caregiver can provide you with a simple table to help with this. If you are using the spirometer at home, follow these instructions: [place] IF:  You are having difficultly using the spirometer. You have trouble using the spirometer as often as  instructed. Your pain medication is not giving enough relief while using the spirometer. You develop fever of 100.5 F (38.1 C) or higher. SEEK IMMEDIATE MEDICAL CARE IF:  You cough up bloody  sputum that had not been present before. You develop fever of 102 F (38.9 C) or greater. You develop worsening pain at or near the incision site. MAKE SURE YOU:  Understand these instructions. Will watch your condition. Will get help right away if you are not doing well or get worse. Document Released: 11/06/2006 Document Revised: 09/18/2011 Document Reviewed: 01/07/2007 [facility] Patient Information 2014 [place], [place].   ________________________________________________________________________

## 2021-04-20 ENCOUNTER — Encounter (HOSPITAL_COMMUNITY): Payer: Self-pay

## 2021-04-20 ENCOUNTER — Encounter (HOSPITAL_COMMUNITY)
Admission: RE | Admit: 2021-04-20 | Discharge: 2021-04-20 | Disposition: A | Discharge status: Home / Self Care | Payer: Medicare PPO | Source: Ambulatory Visit | Attending: Orthopedic Surgery | Admitting: Orthopedic Surgery

## 2021-04-20 ENCOUNTER — Other Ambulatory Visit: Payer: Self-pay

## 2021-04-20 DIAGNOSIS — Z01812 Encounter for preprocedural laboratory examination: Secondary | ICD-10-CM | POA: Insufficient documentation

## 2021-04-20 LAB — CBC
HCT: 43.3 % (ref 39.0–52.0)
Hemoglobin: 14.6 g/dL (ref 13.0–17.0)
MCH: 30 pg (ref 26.0–34.0)
MCHC: 33.7 g/dL (ref 30.0–36.0)
MCV: 89.1 fL (ref 80.0–100.0)
Platelets: 273 10*3/uL (ref 150–400)
RBC: 4.86 MIL/uL (ref 4.22–5.81)
RDW: 13.5 % (ref 11.5–15.5)
WBC: 2.1 10*3/uL — ABNORMAL LOW (ref 4.0–10.5)
nRBC: 0 % (ref 0.0–0.2)

## 2021-04-20 LAB — COMPREHENSIVE METABOLIC PANEL
ALT: 9 U/L (ref 0–44)
AST: 20 U/L (ref 15–41)
Albumin: 4.3 g/dL (ref 3.5–5.0)
Alkaline Phosphatase: 65 U/L (ref 38–126)
Anion gap: 8 (ref 5–15)
BUN: 11 mg/dL (ref 8–23)
CO2: 27 mmol/L (ref 22–32)
Calcium: 9.4 mg/dL (ref 8.9–10.3)
Chloride: 104 mmol/L (ref 98–111)
Creatinine, Ser: 1.06 mg/dL (ref 0.61–1.24)
GFR, Estimated: >60 mL/min (ref >60)
Glucose, Bld: 98 mg/dL (ref 70–99)
Potassium: 4.6 mmol/L (ref 3.5–5.1)
Sodium: 139 mmol/L (ref 135–145)
Total Bilirubin: 0.5 mg/dL (ref 0.3–1.2)
Total Protein: 8.1 g/dL (ref 6.5–8.1)

## 2021-04-20 LAB — SURGICAL PCR SCREEN
MRSA, PCR: NEGATIVE
Staphylococcus aureus: NEGATIVE

## 2021-04-20 LAB — PROTIME-INR
INR: 1.1 (ref 0.8–1.2)
Prothrombin Time: 13.7 seconds (ref 11.4–15.2)

## 2021-04-20 NOTE — Progress Notes (Signed)
COVID Vaccine Completed: Yes Date COVID Vaccine completed: 06/2020 x 4 COVID vaccine manufacturer:  Levan Hurst    COVID Test: 04/25/21  PCP - [facility]. Cardiologist -   Chest x-ray -  EKG - 11/01/20 EPIC Stress Test -  ECHO -  Cardiac Cath -  Pacemaker/ICD device last checked:  Sleep Study -  CPAP -   Fasting Blood Sugar -  Checks Blood Sugar _____ times a day  Blood Thinner Instructions: Aspirin Instructions: Last Dose:  Anesthesia review: Hx: HTN  Patient denies shortness of breath, fever, cough and chest pain at PAT appointment   Patient verbalized understanding of instructions that were given to them at the PAT appointment. Patient was also instructed that they will need to review over the PAT instructions again at home before surgery.

## 2021-04-21 NOTE — Progress Notes (Signed)
Lab. Results: WBC: 2.1

## 2021-04-25 ENCOUNTER — Other Ambulatory Visit: Payer: Self-pay | Admitting: Orthopedic Surgery

## 2021-04-26 LAB — SARS CORONAVIRUS 2 (TAT 6-24 HRS): SARS Coronavirus 2: NEGATIVE

## 2021-04-26 NOTE — H&P (Signed)
TOTAL HIP ADMISSION H&P  Patient is admitted for right total hip arthroplasty.  Subjective:  Chief Complaint: Right hip pain  HPI: Arthur Moreno, 80 y.o. male, has a history of pain and functional disability in the right hip due to arthritis and patient has failed non-surgical conservative treatments for greater than 12 weeks to include NSAID's and/or analgesics, flexibility and strengthening excercises, and activity modification. Onset of symptoms was gradual, starting  several  years ago with gradually worsening course since that time. The patient noted no past surgery on the right hip. Patient currently rates pain in the right hip at 6 out of 10 with activity. Patient has worsening of pain with activity and weight bearing, pain that interfers with activities of daily living, and pain with passive range of motion. Patient has evidence of periarticular osteophytes and joint space narrowing by imaging studies. This condition presents safety issues increasing the risk of falls. There is no current active infection.  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   Essential hypertension 10/30/2020   Hyperlipidemia 10/30/2020   GERD (gastroesophageal reflux disease) 10/30/2020   Osteoarthritis 10/30/2020   Hyponatremia 10/30/2020   Pain in right hand 09/09/2019    Past Medical History:  Diagnosis Date   Acid reflux    Hypertension    Low back pain    Osteoarthritis     Past Surgical History:  Procedure Laterality Date   HEMORRHOID SURGERY     At the age of 56yrs   LAPAROSCOPIC APPENDECTOMY N/A 10/31/2020   Procedure: APPENDECTOMY LAPAROSCOPIC;  Surgeon: Aviva Signs, MD;  Location: [facility];  Service: General;  Laterality: N/A;    Prior to Admission medications   Medication Sig Start Date End Date Taking? Authorizing Provider  acetaminophen (TYLENOL)  500 MG tablet Take 1,000 mg by mouth every 6 (six) hours as needed.   Yes [provider]  amLODipine (NORVASC) 5 MG tablet Take 5 mg by mouth daily.   Yes [provider]  Cholecalciferol (VITAMIN D3 PO) Take 1 capsule by mouth daily.   Yes [provider]  ibuprofen (ADVIL) 200 MG tablet Take 400 mg by mouth every 6 (six) hours as needed for mild pain or moderate pain.   Yes [provider]  lisinopril (ZESTRIL) 40 MG tablet Take 40 mg by mouth daily.   Yes [provider]  loratadine (CLARITIN) 10 MG tablet Take 10 mg by mouth daily.   Yes [provider]  Omeprazole (PRILOSEC PO) Take 20 mg by mouth daily.   Yes [provider]  Turmeric (QC TUMERIC COMPLEX PO) Take 1,000 mg by mouth 2 (two) times daily.   Yes [provider]  VITAMIN B1-B12 IM Inject into the muscle See admin instructions. 1 injections IM every 2 months.   Yes [provider]    No Known Allergies  Social History   Socioeconomic History   Marital status: Married    Spouse name: Not on file   Number of children: Not on file   Years of education: Not on file   Highest education level: Not on file  Occupational History   Not on file  Tobacco Use   Smoking status: Former    Types: Cigarettes    Quit date: 06/07/2011    Years since quitting: 9.8   Smokeless tobacco: Never  Vaping Use   Vaping Use:  Never used  Substance and Sexual Activity   Alcohol use: Yes    Comment: occas.   Drug use: Never   Sexual activity: Not on file  Other Topics Concern   Not on file  Social History Narrative   Not on file   Social Determinants of Health   Financial Resource Strain: Not on file  Food Insecurity: Not on file  Transportation Needs: Not on file  Physical Activity: Not on file  Stress: Not on file  Social Connections: Not on file  Intimate Partner Violence: Not on file    Tobacco Use: Medium Risk   Smoking Tobacco Use: Former    Smokeless Tobacco Use: Never   Social History   Substance and Sexual Activity  Alcohol Use Yes   Comment: occas.    No family history on file.  ROS: Constitutional: no fever, no chills, no night sweats, no significant weight loss Cardiovascular: no chest pain, no palpitations Respiratory: no cough, no shortness of breath, No COPD Gastrointestinal: no vomiting, no nausea Musculoskeletal: no swelling in Joints, Joint Pain Neurologic: no numbness, no tingling, no difficulty with balance    Objective:  Physical Exam: Well nourished and well developed.  General: Alert and oriented x3, cooperative and pleasant, no acute distress.  Head: normocephalic, atraumatic, neck supple.  Eyes: EOMI.  Respiratory: breath sounds clear in all fields, no wheezing, rales, or rhonchi. Cardiovascular: Regular rate and rhythm, no murmurs, gallops or rubs.  Abdomen: non-tender to palpation and soft, normoactive bowel sounds. Musculoskeletal:   The patient has an antalgic gait pattern favoring the right side.     Right Hip Exam:   The range of motion: Flexion to 100 degrees, Internal Rotation to 0 degrees, External Rotation to 20 to 30 degrees, and abduction to 30 degrees with pain.   There is no tenderness over the greater trochanteric bursa.       Left Hip Exam:   The range of motion: Flexion to 110 degrees, Internal Rotation to 20 degrees, External Rotation to 30 degrees, and abduction to 30 degrees without discomfort.   There is no tenderness over the greater trochanteric bursa.   The patient's sensation and motor function are intact in their lower extremities. Their distal pulses are 2+. The bilateral calves are soft and non-tender.     Vital signs in last 24 hours:    Imaging Review Radiographs- AP pelvis, AP and lateral of the right hip dated 01/2021 demonstrate central and inferior narrowing, close to bone-on-bone. He still has some superior joint space left now. This is present on  both hips. He does have subchondral cysts on the right as well as some marginal osteophytes.  Assessment/Plan:  End stage arthritis, right hip  The patient history, physical examination, clinical judgement of the provider and imaging studies are consistent with end stage degenerative joint disease of the right hip and total hip arthroplasty is deemed medically necessary. The treatment options including medical management, injection therapy, arthroscopy and arthroplasty were discussed at length. The risks and benefits of total hip arthroplasty were presented and reviewed. The risks due to aseptic loosening, infection, stiffness, dislocation/subluxation, thromboembolic complications and other imponderables were discussed. The patient acknowledged the explanation, agreed to proceed with the plan and consent was signed. Patient is being admitted for inpatient treatment for surgery, pain control, PT, OT, prophylactic antibiotics, VTE prophylaxis, progressive ambulation and ADLs and discharge planning.The patient is planning to be discharged  home .   Patient's anticipated LOS is less than 2  midnights, meeting these requirements: - Lives within 1 hour of care - Has a competent adult at home to recover with post-op  - NO history of  - Chronic pain requiring opioids  - Diabetes  - Coronary Artery Disease  - Heart failure  - Heart attack  - Stroke  - DVT/VTE  - Cardiac arrhythmia  - Respiratory Failure/COPD  - Renal failure  - Anemia  - Advanced Liver disease    Therapy Plans: HEP Disposition: Home with Wife Planned DVT Prophylaxis: Aspirin 325mg   DME Needed: RW PCP: Bas[facility] ([facility] in [place]) TXA: IV Allergies: None  Anesthesia Concerns: None  BMI: 27.5 Last HgbA1c: N/A  Pharmacy: [facility]  - Patient was instructed on what medications to stop prior to surgery. - Follow-up visit in 2 weeks with Dr. Wynelle Link - Begin physical therapy following surgery - Pre-operative  lab work as pre-surgical testing - Prescriptions will be provided in hospital at time of discharge  Fenton Foy, Surgery [facility], PA-C Orthopedic Surgery [facility] [place]

## 2021-04-26 NOTE — Anesthesia Preprocedure Evaluation (Addendum)
Anesthesia Evaluation  Patient identified by MRN, date of birth, ID band Patient awake    Reviewed: Allergy & Precautions, NPO status , Patient's Chart, lab work & pertinent test results  Airway Mallampati: I       Dental no notable dental hx.    Pulmonary former smoker,    Pulmonary exam normal        Cardiovascular hypertension, Pt. on medications Normal cardiovascular exam     Neuro/Psych negative psych ROS   GI/Hepatic Neg liver ROS, GERD  Medicated,  Endo/Other  negative endocrine ROS  Renal/GU negative Renal ROS     Musculoskeletal  (+) Arthritis , Osteoarthritis,    Abdominal Normal abdominal exam  (+)   Peds  Hematology negative hematology ROS (+)   Anesthesia Other Findings   Reproductive/Obstetrics                            Anesthesia Physical Anesthesia Plan  ASA: 2  Anesthesia Plan: Spinal   Post-op Pain Management:    Induction:   PONV Risk Score and Plan: 2 and Ondansetron and Treatment may vary due to age or medical condition  Airway Management Planned: Natural Airway and Simple Face Mask  Additional Equipment: None  Intra-op Plan:   Post-operative Plan:   Informed Consent: I have reviewed the patients History and Physical, chart, labs and discussed the procedure including the risks, benefits and alternatives for the proposed anesthesia with the patient or authorized representative who has indicated his/her understanding and acceptance.       Plan Discussed with: CRNA  Anesthesia Plan Comments:        Anesthesia Quick Evaluation

## 2021-04-27 ENCOUNTER — Other Ambulatory Visit: Payer: Self-pay

## 2021-04-27 ENCOUNTER — Encounter (HOSPITAL_COMMUNITY): Payer: Self-pay | Admitting: Orthopedic Surgery

## 2021-04-27 ENCOUNTER — Encounter (HOSPITAL_COMMUNITY)
Admission: RE | Disposition: A | Discharge status: Home / Self Care | Payer: Self-pay | Source: Ambulatory Visit | Attending: Orthopedic Surgery

## 2021-04-27 ENCOUNTER — Observation Stay (HOSPITAL_COMMUNITY)
Admission: RE | Admit: 2021-04-27 | Discharge: 2021-04-28 | Disposition: A | Discharge status: Home / Self Care | Payer: Medicare PPO | Source: Ambulatory Visit | Attending: Orthopedic Surgery | Admitting: Orthopedic Surgery

## 2021-04-27 ENCOUNTER — Ambulatory Visit (HOSPITAL_COMMUNITY): Payer: Medicare PPO | Admitting: Physician Assistant

## 2021-04-27 ENCOUNTER — Observation Stay (HOSPITAL_COMMUNITY): Payer: Medicare PPO

## 2021-04-27 ENCOUNTER — Ambulatory Visit (HOSPITAL_COMMUNITY): Payer: Medicare PPO

## 2021-04-27 DIAGNOSIS — Z87891 Personal history of nicotine dependence: Secondary | ICD-10-CM | POA: Diagnosis not present

## 2021-04-27 DIAGNOSIS — M1611 Unilateral primary osteoarthritis, right hip: Principal | ICD-10-CM | POA: Insufficient documentation

## 2021-04-27 DIAGNOSIS — I10 Essential (primary) hypertension: Secondary | ICD-10-CM | POA: Insufficient documentation

## 2021-04-27 DIAGNOSIS — Z96649 Presence of unspecified artificial hip joint: Secondary | ICD-10-CM

## 2021-04-27 DIAGNOSIS — M25551 Pain in right hip: Secondary | ICD-10-CM

## 2021-04-27 DIAGNOSIS — M169 Osteoarthritis of hip, unspecified: Secondary | ICD-10-CM | POA: Diagnosis present

## 2021-04-27 DIAGNOSIS — Z79899 Other long term (current) drug therapy: Secondary | ICD-10-CM | POA: Diagnosis not present

## 2021-04-27 HISTORY — PX: TOTAL HIP ARTHROPLASTY: SHX124

## 2021-04-27 LAB — TYPE AND SCREEN
ABO/RH(D): A POS
Antibody Screen: NEGATIVE

## 2021-04-27 LAB — ABO/RH: ABO/RH(D): A POS

## 2021-04-27 SURGERY — ARTHROPLASTY, HIP, TOTAL, ANTERIOR APPROACH
Anesthesia: Spinal | Site: Hip | Laterality: Right

## 2021-04-27 MED ORDER — ACETAMINOPHEN 10 MG/ML IV SOLN
INTRAVENOUS | Status: AC
  Filled 2021-04-27: qty 100

## 2021-04-27 MED ORDER — WATER FOR IRRIGATION, STERILE IR SOLN
Status: DC | PRN
  Administered 2021-04-27: 2000 mL

## 2021-04-27 MED ORDER — BUPIVACAINE HCL 0.25 % IJ SOLN: INTRAMUSCULAR | Status: DC | PRN

## 2021-04-27 MED ORDER — PHENYLEPHRINE 40 MCG/ML (10ML) SYRINGE FOR IV PUSH (FOR BLOOD PRESSURE SUPPORT)
PREFILLED_SYRINGE | INTRAVENOUS | Status: DC | PRN
  Administered 2021-04-27: 80 ug via INTRAVENOUS
  Administered 2021-04-27: 120 ug via INTRAVENOUS
  Administered 2021-04-27: 80 ug via INTRAVENOUS

## 2021-04-27 MED ORDER — LACTATED RINGERS IV SOLN: INTRAVENOUS | Status: DC

## 2021-04-27 MED ORDER — DEXAMETHASONE SODIUM PHOSPHATE 10 MG/ML IJ SOLN
8.0000 mg | Freq: Once | INTRAMUSCULAR | Status: AC
  Administered 2021-04-27: 8 mg via INTRAVENOUS

## 2021-04-27 MED ORDER — PHENOL 1.4 % MT LIQD: 1.0000 | OROMUCOSAL | Status: DC | PRN

## 2021-04-27 MED ORDER — TRAMADOL HCL 50 MG PO TABS
50.0000 mg | ORAL_TABLET | Freq: Four times a day (QID) | ORAL | Status: DC | PRN
  Administered 2021-04-28 (×2): 100 mg via ORAL
  Filled 2021-04-27 (×2): qty 2

## 2021-04-27 MED ORDER — PHENYLEPHRINE 40 MCG/ML (10ML) SYRINGE FOR IV PUSH (FOR BLOOD PRESSURE SUPPORT)
PREFILLED_SYRINGE | INTRAVENOUS | Status: AC
  Filled 2021-04-27: qty 10

## 2021-04-27 MED ORDER — MENTHOL 3 MG MT LOZG: 1.0000 | LOZENGE | OROMUCOSAL | Status: DC | PRN

## 2021-04-27 MED ORDER — ONDANSETRON HCL 4 MG/2ML IJ SOLN
INTRAMUSCULAR | Status: AC
  Filled 2021-04-27: qty 2

## 2021-04-27 MED ORDER — DEXAMETHASONE SODIUM PHOSPHATE 10 MG/ML IJ SOLN
10.0000 mg | Freq: Once | INTRAMUSCULAR | Status: AC
  Administered 2021-04-28: 10 mg via INTRAVENOUS
  Filled 2021-04-27: qty 1

## 2021-04-27 MED ORDER — PHENYLEPHRINE HCL-NACL 20-0.9 MG/250ML-% IV SOLN
INTRAVENOUS | Status: DC | PRN
  Administered 2021-04-27: 40 ug/min via INTRAVENOUS

## 2021-04-27 MED ORDER — CEFAZOLIN SODIUM-DEXTROSE 2-4 GM/100ML-% IV SOLN
2.0000 g | INTRAVENOUS | Status: AC
  Administered 2021-04-27: 2 g via INTRAVENOUS
  Filled 2021-04-27: qty 100

## 2021-04-27 MED ORDER — ACETAMINOPHEN 10 MG/ML IV SOLN: 1000.0000 mg | Freq: Once | INTRAVENOUS | Status: DC | PRN

## 2021-04-27 MED ORDER — METHOCARBAMOL 500 MG PO TABS
ORAL_TABLET | ORAL | Status: AC
  Filled 2021-04-27: qty 1

## 2021-04-27 MED ORDER — BUPIVACAINE-EPINEPHRINE (PF) 0.25% -1:200000 IJ SOLN
INTRAMUSCULAR | Status: DC | PRN
  Administered 2021-04-27: 30 mL

## 2021-04-27 MED ORDER — METOCLOPRAMIDE HCL 5 MG PO TABS: 5.0000 mg | ORAL_TABLET | Freq: Three times a day (TID) | ORAL | Status: DC | PRN

## 2021-04-27 MED ORDER — MEPERIDINE HCL 50 MG/ML IJ SOLN: 6.2500 mg | INTRAMUSCULAR | Status: DC | PRN

## 2021-04-27 MED ORDER — METOCLOPRAMIDE HCL 5 MG/ML IJ SOLN: 5.0000 mg | Freq: Three times a day (TID) | INTRAMUSCULAR | Status: DC | PRN

## 2021-04-27 MED ORDER — ASPIRIN EC 325 MG PO TBEC
325.0000 mg | DELAYED_RELEASE_TABLET | Freq: Two times a day (BID) | ORAL | Status: DC
  Administered 2021-04-28: 325 mg via ORAL
  Filled 2021-04-27: qty 1

## 2021-04-27 MED ORDER — METHOCARBAMOL 500 MG IVPB - SIMPLE MED
500.0000 mg | Freq: Four times a day (QID) | INTRAVENOUS | Status: DC | PRN
  Filled 2021-04-27: qty 50

## 2021-04-27 MED ORDER — ONDANSETRON HCL 4 MG/2ML IJ SOLN: 4.0000 mg | Freq: Once | INTRAMUSCULAR | Status: DC | PRN

## 2021-04-27 MED ORDER — BUPIVACAINE IN DEXTROSE 0.75-8.25 % IT SOLN
INTRATHECAL | Status: DC | PRN
  Administered 2021-04-27: 1.6 mL via INTRATHECAL

## 2021-04-27 MED ORDER — PROPOFOL 500 MG/50ML IV EMUL
INTRAVENOUS | Status: DC | PRN
  Administered 2021-04-27: 25 ug/kg/min via INTRAVENOUS

## 2021-04-27 MED ORDER — ACETAMINOPHEN 10 MG/ML IV SOLN
1000.0000 mg | Freq: Four times a day (QID) | INTRAVENOUS | Status: DC
  Administered 2021-04-27: 1000 mg via INTRAVENOUS
  Filled 2021-04-27: qty 100

## 2021-04-27 MED ORDER — PANTOPRAZOLE SODIUM 40 MG PO TBEC
40.0000 mg | DELAYED_RELEASE_TABLET | Freq: Every day | ORAL | Status: DC
  Administered 2021-04-28: 40 mg via ORAL
  Filled 2021-04-27: qty 1

## 2021-04-27 MED ORDER — DEXAMETHASONE SODIUM PHOSPHATE 10 MG/ML IJ SOLN
INTRAMUSCULAR | Status: AC
  Filled 2021-04-27: qty 1

## 2021-04-27 MED ORDER — AMLODIPINE BESYLATE 5 MG PO TABS
5.0000 mg | ORAL_TABLET | Freq: Every day | ORAL | Status: DC
  Administered 2021-04-28: 5 mg via ORAL
  Filled 2021-04-27: qty 1

## 2021-04-27 MED ORDER — CEFAZOLIN SODIUM-DEXTROSE 2-4 GM/100ML-% IV SOLN
2.0000 g | Freq: Four times a day (QID) | INTRAVENOUS | Status: AC
  Administered 2021-04-27 (×2): 2 g via INTRAVENOUS
  Filled 2021-04-27 (×2): qty 100

## 2021-04-27 MED ORDER — SODIUM CHLORIDE 0.9 % IV SOLN: INTRAVENOUS | Status: DC

## 2021-04-27 MED ORDER — TRANEXAMIC ACID-NACL 1000-0.7 MG/100ML-% IV SOLN
1000.0000 mg | INTRAVENOUS | Status: AC
  Administered 2021-04-27: 1000 mg via INTRAVENOUS
  Filled 2021-04-27: qty 100

## 2021-04-27 MED ORDER — ORAL CARE MOUTH RINSE: 15.0000 mL | Freq: Once | OROMUCOSAL | Status: AC

## 2021-04-27 MED ORDER — DOCUSATE SODIUM 100 MG PO CAPS
100.0000 mg | ORAL_CAPSULE | Freq: Two times a day (BID) | ORAL | Status: DC
  Administered 2021-04-27 – 2021-04-28 (×2): 100 mg via ORAL
  Filled 2021-04-27 (×2): qty 1

## 2021-04-27 MED ORDER — PHENYLEPHRINE HCL (PRESSORS) 10 MG/ML IV SOLN
INTRAVENOUS | Status: AC
  Filled 2021-04-27: qty 2

## 2021-04-27 MED ORDER — MORPHINE SULFATE (PF) 2 MG/ML IV SOLN
0.5000 mg | INTRAVENOUS | Status: DC | PRN
Start: 2021-04-27 — End: 2021-04-28

## 2021-04-27 MED ORDER — FENTANYL CITRATE PF 50 MCG/ML IJ SOSY: 25.0000 ug | PREFILLED_SYRINGE | INTRAMUSCULAR | Status: DC | PRN

## 2021-04-27 MED ORDER — ONDANSETRON HCL 4 MG/2ML IJ SOLN
INTRAMUSCULAR | Status: DC | PRN
  Administered 2021-04-27: 4 mg via INTRAVENOUS

## 2021-04-27 MED ORDER — ONDANSETRON HCL 4 MG/2ML IJ SOLN: 4.0000 mg | Freq: Four times a day (QID) | INTRAMUSCULAR | Status: DC | PRN

## 2021-04-27 MED ORDER — HYDROCODONE-ACETAMINOPHEN 7.5-325 MG PO TABS
1.0000 | ORAL_TABLET | ORAL | Status: DC | PRN
  Administered 2021-04-27: 1 via ORAL
  Filled 2021-04-27: qty 1

## 2021-04-27 MED ORDER — BUPIVACAINE-EPINEPHRINE (PF) 0.25% -1:200000 IJ SOLN
INTRAMUSCULAR | Status: AC
  Filled 2021-04-27: qty 30

## 2021-04-27 MED ORDER — 0.9 % SODIUM CHLORIDE (POUR BTL) OPTIME
TOPICAL | Status: DC | PRN
  Administered 2021-04-27: 1000 mL

## 2021-04-27 MED ORDER — HYDROCODONE-ACETAMINOPHEN 5-325 MG PO TABS
1.0000 | ORAL_TABLET | ORAL | Status: DC | PRN
  Administered 2021-04-27: 1 via ORAL
  Filled 2021-04-27: qty 1

## 2021-04-27 MED ORDER — POLYETHYLENE GLYCOL 3350 17 G PO PACK: 17.0000 g | PACK | Freq: Every day | ORAL | Status: DC | PRN

## 2021-04-27 MED ORDER — METHOCARBAMOL 500 MG PO TABS
500.0000 mg | ORAL_TABLET | Freq: Four times a day (QID) | ORAL | Status: DC | PRN
  Administered 2021-04-27: 500 mg via ORAL

## 2021-04-27 MED ORDER — CHLORHEXIDINE GLUCONATE 0.12 % MT SOLN
15.0000 mL | Freq: Once | OROMUCOSAL | Status: AC
  Administered 2021-04-27: 15 mL via OROMUCOSAL

## 2021-04-27 MED ORDER — ACETAMINOPHEN 325 MG PO TABS: 325.0000 mg | ORAL_TABLET | Freq: Four times a day (QID) | ORAL | Status: DC | PRN

## 2021-04-27 MED ORDER — POVIDONE-IODINE 10 % EX SWAB
2.0000 "application " | Freq: Once | CUTANEOUS | Status: AC
  Administered 2021-04-27: 2 via TOPICAL

## 2021-04-27 MED ORDER — ONDANSETRON HCL 4 MG PO TABS: 4.0000 mg | ORAL_TABLET | Freq: Four times a day (QID) | ORAL | Status: DC | PRN

## 2021-04-27 MED ORDER — PROPOFOL 10 MG/ML IV BOLUS
INTRAVENOUS | Status: DC | PRN
  Administered 2021-04-27 (×2): 20 mg via INTRAVENOUS

## 2021-04-27 MED ORDER — BISACODYL 10 MG RE SUPP: 10.0000 mg | Freq: Every day | RECTAL | Status: DC | PRN

## 2021-04-27 SURGICAL SUPPLY — 41 items
BAG COUNTER SPONGE SURGICOUNT (BAG) ×2 IMPLANT
BAG DECANTER FOR FLEXI CONT (MISCELLANEOUS) IMPLANT
BAG ZIPLOCK 12X15 (MISCELLANEOUS) IMPLANT
BLADE SAG 18X100X1.27 (BLADE) ×2 IMPLANT
COVER PERINEAL POST (MISCELLANEOUS) ×2 IMPLANT
COVER SURGICAL LIGHT HANDLE (MISCELLANEOUS) ×2 IMPLANT
CUP ACETBLR 52 OD PINNACLE (Hips) ×2 IMPLANT
DECANTER SPIKE VIAL GLASS SM (MISCELLANEOUS) IMPLANT
DRAPE FOOT SWITCH (DRAPES) ×2 IMPLANT
DRAPE STERI IOBAN 125X83 (DRAPES) ×2 IMPLANT
DRAPE U-SHAPE 47X51 STRL (DRAPES) ×4 IMPLANT
DRSG AQUACEL AG ADV 3.5X10 (GAUZE/BANDAGES/DRESSINGS) ×2 IMPLANT
DURAPREP 26ML APPLICATOR (WOUND CARE) ×2 IMPLANT
ELECT REM PT RETURN 15FT ADLT (MISCELLANEOUS) ×2 IMPLANT
FEM STEM 12/14 TAPER SZ 4 HIP (Orthopedic Implant) ×2 IMPLANT
FEMORAL STEM 12/14 TPR SZ4 HIP (Orthopedic Implant) ×1 IMPLANT
GLOVE SRG 8 PF TXTR STRL LF DI (GLOVE) ×1 IMPLANT
GLOVE SURG ENC MOIS LTX SZ6.5 (GLOVE) IMPLANT
GLOVE SURG ENC MOIS LTX SZ7 (GLOVE) IMPLANT
GLOVE SURG ENC MOIS LTX SZ8 (GLOVE) ×2 IMPLANT
GLOVE SURG UNDER POLY LF SZ7 (GLOVE) IMPLANT
GLOVE SURG UNDER POLY LF SZ8 (GLOVE) ×1
GLOVE SURG UNDER POLY LF SZ8.5 (GLOVE) ×2 IMPLANT
GOWN STRL REUS W/TWL LRG LVL3 (GOWN DISPOSABLE) ×2 IMPLANT
GOWN STRL REUS W/TWL XL LVL3 (GOWN DISPOSABLE) ×2 IMPLANT
HEAD FEM STD 32X+1 STRL (Hips) ×2 IMPLANT
HOLDER FOLEY CATH W/STRAP (MISCELLANEOUS) ×2 IMPLANT
LINER MARATHON NEUT +4X52X32 (Hips) ×2 IMPLANT
MANIFOLD NEPTUNE II (INSTRUMENTS) ×2 IMPLANT
PACK ANTERIOR HIP CUSTOM (KITS) ×2 IMPLANT
PENCIL SMOKE EVACUATOR COATED (MISCELLANEOUS) ×2 IMPLANT
STRIP CLOSURE SKIN 1/2X4 (GAUZE/BANDAGES/DRESSINGS) ×4 IMPLANT
SUT ETHIBOND NAB CT1 #1 30IN (SUTURE) ×2 IMPLANT
SUT MNCRL AB 4-0 PS2 18 (SUTURE) ×2 IMPLANT
SUT STRATAFIX 0 PDS 27 VIOLET (SUTURE) ×2
SUT VIC AB 2-0 CT1 27 (SUTURE) ×2
SUT VIC AB 2-0 CT1 TAPERPNT 27 (SUTURE) ×2 IMPLANT
SUTURE STRATFX 0 PDS 27 VIOLET (SUTURE) ×1 IMPLANT
SYR 50ML LL SCALE MARK (SYRINGE) IMPLANT
TRAY FOLEY MTR SLVR 16FR STAT (SET/KITS/TRAYS/PACK) ×2 IMPLANT
TUBE SUCTION HIGH CAP CLEAR NV (SUCTIONS) ×2 IMPLANT

## 2021-04-27 NOTE — Plan of Care (Signed)
  Problem: Education: Goal: Knowledge of General Education information will improve Description: Including pain rating scale, medication(s)/side effects and non-pharmacologic comfort measures Outcome: Progressing   Problem: Health Behavior/Discharge Planning: Goal: Ability to manage health-related needs will improve Outcome: Progressing   Problem: Clinical Measurements: Goal: Ability to maintain clinical measurements within normal limits will improve Outcome: Progressing Goal: Will remain free from infection Outcome: Progressing Goal: Diagnostic test results will improve Outcome: Progressing   Problem: Activity: Goal: Risk for activity intolerance will decrease Outcome: Progressing   Problem: Nutrition: Goal: Adequate nutrition will be maintained Outcome: Progressing   Problem: Elimination: Goal: Will not experience complications related to bowel motility Outcome: Progressing   Problem: Safety: Goal: Ability to remain free from injury will improve Outcome: Progressing   Problem: Education: Goal: Knowledge of the prescribed therapeutic regimen will improve Outcome: Progressing Goal: Understanding of discharge needs will improve Outcome: Progressing   Problem: Activity: Goal: Ability to avoid complications of mobility impairment will improve Outcome: Progressing   Problem: Pain Management: Goal: Pain level will decrease with appropriate interventions Outcome: Progressing

## 2021-04-27 NOTE — Evaluation (Signed)
Physical Therapy Evaluation Patient Details Name: Arthur Moreno MRN: [id number] DOB: September 30, 1940 Today's Date: 04/27/2021  History of Present Illness  Patient is 80 y.o. male s/p Rt THA anterior approach on 04/27/21 with PMH significant for HTN, OA, low back pain.    Clinical Impression  Arthur Moreno is a 80 y.o. male POD 0 s/p Rt THA. Patient reports independence with mobility at baseline. Patient is now limited by functional impairments (see PT problem list below) and requires min-mod assist for transfers and gait with RW. Patient was able to ambulate ~12 feet with RW and min-mod assist. Patient instructed in exercise to facilitate ROM and circulation to manage edema and reduce risk of DVT. Patient will benefit from continued skilled PT interventions to address impairments and progress towards PLOF. Acute PT will follow to progress mobility and stair training in preparation for safe discharge home.        Recommendations for follow up therapy are one component of a multi-disciplinary discharge planning process, led by the attending physician.  Recommendations may be updated based on patient status, additional functional criteria and insurance authorization.  Follow Up Recommendations Follow surgeon's recommendation for DC plan and follow-up therapies;Home health PT    Equipment Recommendations  Rolling walker with 5" wheels    Recommendations for Other Services       Precautions / Restrictions Precautions Precautions: Fall Restrictions Weight Bearing Restrictions: No      Mobility  Bed Mobility Overal bed mobility: Needs Assistance Bed Mobility: Supine to Sit     Supine to sit: Min guard;Min assist;HOB elevated     General bed mobility comments: light assist to bring Rt LE off EOB fully. cues for sequencing and to scoot to EOB.    Transfers Overall transfer level: Needs assistance Equipment used: Rolling walker (2 wheeled) Transfers: Sit to/from Stand Sit to Stand: Min  assist         General transfer comment: cues for hand placement for power up. Assist to initiate rise and steady with standing. cues for WBOS to improve stability.  Ambulation/Gait Ambulation/Gait assistance: Min assist;Mod assist Gait Distance (Feet): 12 Feet Assistive device: Rolling walker (2 wheeled) Gait Pattern/deviations: Step-to pattern;Decreased stride length;Decreased weight shift to right Gait velocity: decr   General Gait Details: cues for step to pattern and min-mod assist to manage walker position and steady balance due to slight hip weakness (suspect due to spinal block).  Stairs            Wheelchair Mobility    Modified Rankin (Stroke Patients Only)       Balance Overall balance assessment: Needs assistance Sitting-balance support: Feet supported Sitting balance-Leahy Scale: Good     Standing balance support: During functional activity;Bilateral upper extremity supported Standing balance-Leahy Scale: Poor Standing balance comment: reliant on RW and external support of therapist                             Pertinent Vitals/Pain Pain Assessment: 0-10 Pain Score: 4  Pain Location: Rt hip Pain Descriptors / Indicators: Aching;Discomfort Pain Intervention(s): Limited activity within patient's tolerance;Monitored during session;Repositioned;Ice applied    Home Living Family/patient expects to be discharged to:: Private residence Living Arrangements: Spouse/significant other Available Help at Discharge: Family Type of Home: House Home Access: Stairs to enter Entrance Stairs-Rails: Left Entrance Stairs-Number of Steps: 4 Home Layout: One level Home Equipment: Adrian - single point;Shower seat - built in      Prior Function  Level of Independence: Independent               Hand Dominance   Dominant Hand: Right    Extremity/Trunk Assessment   Upper Extremity Assessment Upper Extremity Assessment: Overall WFL for tasks  assessed    Lower Extremity Assessment Lower Extremity Assessment: Overall WFL for tasks assessed (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)    Cervical / Trunk Assessment Cervical / Trunk Assessment: Normal  Communication   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)  Cognition Arousal/Alertness: Awake/alert Behavior During Therapy: WFL for tasks assessed/performed Overall Cognitive Status: Within Functional Limits for tasks assessed                                        General Comments      Exercises Total Joint Exercises Ankle Circles/Pumps: AROM;Both;20 reps;Seated Quad Sets: AROM;Right;5 reps;Seated Heel Slides: AROM;Right;5 reps;Seated   Assessment/Plan    PT Assessment Patient needs continued PT services  PT Problem List Decreased strength;Decreased range of motion;Decreased activity tolerance;Decreased balance;Decreased mobility;Decreased knowledge of use of DME;Decreased knowledge of precautions;Pain       PT Treatment Interventions DME instruction;Gait training;Stair training;Functional mobility training;Therapeutic activities;Therapeutic exercise;Balance training;Patient/family education    PT Goals (Current goals can be found in the Care Plan section)  Acute Rehab PT Goals Patient Stated Goal: get back to golfing PT Goal Formulation: With patient Time For Goal Achievement: 05/04/21 Potential to Achieve Goals: Good    Frequency 7X/week   Barriers to discharge        Co-evaluation               AM-PAC PT "6 Clicks" Mobility  Outcome Measure Help needed turning from your back to your side while in a flat bed without using bedrails?: A Little Help needed moving from lying on your back to sitting on the side of a flat bed without using bedrails?: A Little Help needed moving to and from a bed to a chair (including a wheelchair)?: A Little Help needed standing up from a chair using your arms (e.g., wheelchair or bedside chair)?: A  Little Help needed to walk in hospital room?: A Lot Help needed climbing 3-5 steps with a railing? : A Lot 6 Click Score: 16    End of Session Equipment Utilized During Treatment: Gait belt Activity Tolerance: Patient tolerated treatment well Patient left: in chair;with call bell/phone within reach;with chair alarm set;with family/visitor present Nurse Communication: Mobility status PT Visit Diagnosis: Muscle weakness (generalized) (M62.81);Difficulty in walking, not elsewhere classified (R26.2);Unsteadiness on feet (R26.81)    Time: 8421-0312 PT Time Calculation (min) (ACUTE ONLY): 17 min   Charges:   PT Evaluation $PT Eval Low Complexity: 1 Low          Verner Mould, DPT Acute Rehabilitation Services Office [phone] Pager [phone]   Jacques Navy 04/27/2021, 1:02 PM

## 2021-04-27 NOTE — Anesthesia Procedure Notes (Signed)
Spinal  Patient location during procedure: OR Start time: 04/27/2021 8:33 AM End time: 04/27/2021 8:36 AM Reason for block: surgical anesthesia Staffing Performed: anesthesiologist  Anesthesiologist: Lyn Hollingshead, MD Preanesthetic Checklist Completed: patient identified, IV checked, site marked, risks and benefits discussed, surgical consent, monitors and equipment checked, pre-op evaluation and timeout performed Spinal Block Patient position: sitting Prep: DuraPrep and site prepped and draped Patient monitoring: continuous pulse ox and blood pressure Approach: midline Location: L3-4 Injection technique: single-shot Needle Needle type: Pencan  Needle gauge: 24 G Needle length: 10 cm Needle insertion depth: 6 cm Assessment Sensory level: T8 Events: CSF return

## 2021-04-27 NOTE — Anesthesia Procedure Notes (Signed)
Procedure Name: MAC Date/Time: 04/27/2021 8:29 AM Performed by: Deliah Boston, CRNA Pre-anesthesia Checklist: Patient identified, Emergency Drugs available, Suction available and Patient being monitored Patient Re-evaluated:Patient Re-evaluated prior to induction Oxygen Delivery Method: Simple face mask Placement Confirmation: positive ETCO2

## 2021-04-27 NOTE — Anesthesia Postprocedure Evaluation (Signed)
Anesthesia Post Note  Patient: Arthur Moreno  Procedure(s) Performed: TOTAL HIP ARTHROPLASTY ANTERIOR APPROACH (Right: Hip)     Patient location during evaluation: PACU Anesthesia Type: Spinal Level of consciousness: awake and sedated Pain management: pain level controlled Vital Signs Assessment: post-procedure vital signs reviewed and stable Respiratory status: spontaneous breathing Cardiovascular status: stable Postop Assessment: no headache, no backache, spinal receding, patient able to bend at knees and no apparent nausea or vomiting Anesthetic complications: no   No notable events documented.  Last Vitals:  Vitals:   04/27/21 1130 04/27/21 1148  BP: 135/78 129/69  Pulse: 68 65  Resp: 16 18  Temp: 36.5 C 36.5 C  SpO2: 100% 100%    Last Pain:  Vitals:   04/27/21 1148  TempSrc: Oral  PainSc:                  Huston Foley

## 2021-04-27 NOTE — Interval H&P Note (Signed)
History and Physical Interval Note:  04/27/2021 8:18 AM  Arthur Moreno  has presented today for surgery, with the diagnosis of right hip osteoarthritis.  The various methods of treatment have been discussed with the patient and family. After consideration of risks, benefits and other options for treatment, the patient has consented to  Procedure(s): TOTAL HIP ARTHROPLASTY ANTERIOR APPROACH (Right) as a surgical intervention.  The patient's history has been reviewed, patient examined, no change in status, stable for surgery.  I have reviewed the patient's chart and labs.  Questions were answered to the patient's satisfaction.     Pilar Plate Clemens Lachman

## 2021-04-27 NOTE — Discharge Instructions (Signed)
°Frank Aluisio, MD °Total Joint Specialist °[facility] °[address] °[place], [place] [address] °[phone] ° °ANTERIOR APPROACH TOTAL HIP REPLACEMENT POSTOPERATIVE DIRECTIONS ° ° ° ° °Hip Rehabilitation, Guidelines Following Surgery  °The results of a hip operation are greatly improved after range of motion and muscle strengthening exercises. Follow all safety measures which are given to protect your hip. If any of these exercises cause increased pain or swelling in your joint, decrease the amount until you are comfortable again. Then slowly increase the exercises. Call your caregiver if you have problems or questions.  ° °BLOOD CLOT PREVENTION °Take a 325 mg Aspirin two times a day for three weeks following surgery. Then take an 81 mg Aspirin once a day for three weeks. Then discontinue Aspirin. °You may resume your vitamins/supplements upon discharge from the hospital. °Do not take any NSAIDs (Advil, Aleve, Ibuprofen, Meloxicam, etc.) until you have discontinued the 325 mg Aspirin. ° °HOME CARE INSTRUCTIONS  °Remove items at home which could result in a fall. This includes throw rugs or furniture in walking pathways.  °ICE to the affected hip as frequently as 20-30 minutes an hour and then as needed for pain and swelling. Continue to use ice on the hip for pain and swelling from surgery. You may notice swelling that will progress down to the foot and ankle. This is normal after surgery. Elevate the leg when you are not up walking on it.   °Continue to use the breathing machine which will help keep your temperature down.  It is common for your temperature to cycle up and down following surgery, especially at night when you are not up moving around and exerting yourself.  The breathing machine keeps your lungs expanded and your temperature down. ° °DIET °You may resume your previous home diet once your are discharged from the hospital. ° °DRESSING / WOUND CARE / SHOWERING °You have  an adhesive waterproof bandage over the incision. Leave this in place until your first follow-up appointment. Once you remove this you will not need to place another bandage.  °You may begin showering 3 days following surgery, but do not submerge the incision under water. ° °ACTIVITY °For the first 3-5 days, it is important to rest and keep the operative leg elevated. You should, as a general rule, rest for 50 minutes and walk/stretch for 10 minutes per hour. After 5 days, you may slowly increase activity as tolerated.  °Perform the exercises you were provided twice a day for about 15-20 minutes each session. Begin these 2 days following surgery. °Walk with your walker as instructed. Use the walker until you are comfortable transitioning to a cane. Walk with the cane in the opposite hand of the operative leg. You may discontinue the cane once you are comfortable and walking steadily. °Avoid periods of inactivity such as sitting longer than an hour when not asleep. This helps prevent blood clots.  °Do not drive a car for 6 weeks or until released by your surgeon.  °Do not drive while taking narcotics. ° °TED HOSE STOCKINGS °Wear the elastic stockings on both legs for three weeks following surgery during the day. You may remove them at night while sleeping. ° °WEIGHT BEARING °Weight bearing as tolerated with assist device (walker, cane, etc) as directed, use it as long as suggested by your surgeon or therapist, typically at least 4-6 weeks. ° °POSTOPERATIVE CONSTIPATION PROTOCOL °Constipation - defined medically as fewer than three stools per week and severe constipation as   less than one stool per week. ° °One of the most common issues patients have following surgery is constipation.  Even if you have a regular bowel pattern at home, your normal regimen is likely to be disrupted due to multiple reasons following surgery.  Combination of anesthesia, postoperative narcotics, change in appetite and fluid intake all can  affect your bowels.  In order to avoid complications following surgery, here are some recommendations in order to help you during your recovery period. ° °Colace (docusate) - Pick up an over-the-counter form of Colace or another stool softener and take twice a day as long as you are requiring postoperative pain medications.  Take with a full glass of water daily.  If you experience loose stools or diarrhea, hold the colace until you stool forms back up.  If your symptoms do not get better within 1 week or if they get worse, check with your doctor. °Dulcolax (bisacodyl) - Pick up over-the-counter and take as directed by the product packaging as needed to assist with the movement of your bowels.  Take with a full glass of water.  Use this product as needed if not relieved by Colace only.  °MiraLax (polyethylene glycol) - Pick up over-the-counter to have on hand.  MiraLax is a solution that will increase the amount of water in your bowels to assist with bowel movements.  Take as directed and can mix with a glass of water, juice, soda, coffee, or tea.  Take if you go more than two days without a movement.Do not use MiraLax more than once per day. Call your doctor if you are still constipated or irregular after using this medication for 7 days in a row. ° °If you continue to have problems with postoperative constipation, please contact the office for further assistance and recommendations.  If you experience "the worst abdominal pain ever" or develop nausea or vomiting, please contact the office immediatly for further recommendations for treatment. ° °ITCHING ° If you experience itching with your medications, try taking only a single pain pill, or even half a pain pill at a time.  You can also use Benadryl over the counter for itching or also to help with sleep.  ° °MEDICATIONS °See your medication summary on the “After Visit Summary” that the nursing staff will review with you prior to discharge.  You may have some home  medications which will be placed on hold until you complete the course of blood thinner medication.  It is important for you to complete the blood thinner medication as prescribed by your surgeon.  Continue your approved medications as instructed at time of discharge. ° °PRECAUTIONS °If you experience chest pain or shortness of breath - call 911 immediately for transfer to the hospital emergency department.  °If you develop a fever greater that 101 F, purulent drainage from wound, increased redness or drainage from wound, foul odor from the wound/dressing, or calf pain - CONTACT YOUR SURGEON.   °                                                °FOLLOW-UP APPOINTMENTS °Make sure you keep all of your appointments after your operation with your surgeon and caregivers. You should call the office at the above phone number and make an appointment for approximately two weeks after the date of your surgery or on the   date instructed by your surgeon outlined in the "After Visit Summary". ° °RANGE OF MOTION AND STRENGTHENING EXERCISES  °These exercises are designed to help you keep full movement of your hip joint. Follow your caregiver's or physical therapist's instructions. Perform all exercises about fifteen times, three times per day or as directed. Exercise both hips, even if you have had only one joint replacement. These exercises can be done on a training (exercise) mat, on the floor, on a table or on a bed. Use whatever works the best and is most comfortable for you. Use music or television while you are exercising so that the exercises are a pleasant break in your day. This will make your life better with the exercises acting as a break in routine you can look forward to.  °Lying on your back, slowly slide your foot toward your buttocks, raising your knee up off the floor. Then slowly slide your foot back down until your leg is straight again.  °Lying on your back spread your legs as far apart as you can without causing  discomfort.  °Lying on your side, raise your upper leg and foot straight up from the floor as far as is comfortable. Slowly lower the leg and repeat.  °Lying on your back, tighten up the muscle in the front of your thigh (quadriceps muscles). You can do this by keeping your leg straight and trying to raise your heel off the floor. This helps strengthen the largest muscle supporting your knee.  °Lying on your back, tighten up the muscles of your buttocks both with the legs straight and with the knee bent at a comfortable angle while keeping your heel on the floor.  ° °POST-OPERATIVE OPIOID TAPER INSTRUCTIONS: °It is important to wean off of your opioid medication as soon as possible. If you do not need pain medication after your surgery it is ok to stop day one. °Opioids include: °Codeine, Hydrocodone(Norco, Vicodin), Oxycodone(Percocet, oxycontin) and hydromorphone amongst others.  °Long term and even short term use of opiods can cause: °Increased pain response °Dependence °Constipation °Depression °Respiratory depression °And more.  °Withdrawal symptoms can include °Flu like symptoms °Nausea, vomiting °And more °Techniques to manage these symptoms °Hydrate well °Eat regular healthy meals °Stay active °Use relaxation techniques(deep breathing, meditating, yoga) °Do Not substitute Alcohol to help with tapering °If you have been on opioids for less than two weeks and do not have pain than it is ok to stop all together.  °Plan to wean off of opioids °This plan should start within one week post op of your joint replacement. °Maintain the same interval or time between taking each dose and first decrease the dose.  °Cut the total daily intake of opioids by one tablet each day °Next start to increase the time between doses. °The last dose that should be eliminated is the evening dose.  ° °IF YOU ARE TRANSFERRED TO A SKILLED REHAB FACILITY °If the patient is transferred to a skilled rehab facility following release from the  hospital, a list of the current medications will be sent to the facility for the patient to continue.  When discharged from the skilled rehab facility, please have the facility set up the patient's Home Health Physical Therapy prior to being released. Also, the skilled facility will be responsible for providing the patient with their medications at time of release from the facility to include their pain medication, the muscle relaxants, and their blood thinner medication. If the patient is still at the rehab facility   at time of the two week follow up appointment, the skilled rehab facility will also need to assist the patient in arranging follow up appointment in our office and any transportation needs. ° °MAKE SURE YOU:  °Understand these instructions.  °Get help right away if you are not doing well or get worse.  ° ° °DENTAL ANTIBIOTICS: ° °In most cases prophylactic antibiotics for Dental procdeures after total joint surgery are not necessary. ° °Exceptions are as follows: ° °1. History of prior total joint infection ° °2. Severely immunocompromised (Organ Transplant, cancer chemotherapy, Rheumatoid biologic °meds such as Humera) ° °3. Poorly controlled diabetes (A1C &gt; 8.0, blood glucose over 200) ° °If you have one of these conditions, contact your surgeon for an antibiotic prescription, prior to your °dental procedure.  ° ° °Pick up stool softner and laxative for home use following surgery while on pain medications. °Do not submerge incision under water. °Please use good hand washing techniques while changing dressing each day. °May shower starting three days after surgery. °Please use a clean towel to pat the incision dry following showers. °Continue to use ice for pain and swelling after surgery. °Do not use any lotions or creams on the incision until instructed by your surgeon. ° °

## 2021-04-27 NOTE — Op Note (Signed)
OPERATIVE REPORT- TOTAL HIP ARTHROPLASTY   PREOPERATIVE DIAGNOSIS: Osteoarthritis of the Right hip.   POSTOPERATIVE DIAGNOSIS: Osteoarthritis of the Right  hip.   PROCEDURE: Right total hip arthroplasty, anterior approach.   SURGEON: Gaynelle Arabian, MD   ASSISTANT: Fenton Foy, PA-C  ANESTHESIA:  Spinal  ESTIMATED BLOOD LOSS:-300 mL    DRAINS: Hemovac x1.   COMPLICATIONS: None   CONDITION: PACU - hemodynamically stable.   BRIEF CLINICAL NOTE: Arthur Moreno is a 80 y.o. male who has advanced end-  stage arthritis of their Right  hip with progressively worsening pain and  dysfunction.The patient has failed nonoperative management and presents for  total hip arthroplasty.   PROCEDURE IN DETAIL: After successful administration of spinal  anesthetic, the traction boots for the [facility] bed were placed on both  feet and the patient was placed onto the [facility] bed, boots placed into the leg  holders. The Right hip was then isolated from the perineum with plastic  drapes and prepped and draped in the usual sterile fashion. ASIS and  greater trochanter were marked and a oblique incision was made, starting  at about 1 cm lateral and 2 cm distal to the ASIS and coursing towards  the anterior cortex of the femur. The skin was cut with a 10 blade  through subcutaneous tissue to the level of the fascia overlying the  tensor fascia lata muscle. The fascia was then incised in line with the  incision at the junction of the anterior third and posterior 2/3rd. The  muscle was teased off the fascia and then the interval between the TFL  and the rectus was developed. The Hohmann retractor was then placed at  the top of the femoral neck over the capsule. The vessels overlying the  capsule were cauterized and the fat on top of the capsule was removed.  A Hohmann retractor was then placed anterior underneath the rectus  femoris to give exposure to the entire anterior capsule. A T-shaped   capsulotomy was performed. The edges were tagged and the femoral head  was identified.       Osteophytes are removed off the superior acetabulum.  The femoral neck was then cut in situ with an oscillating saw. Traction  was then applied to the left lower extremity utilizing the [facility]  traction. The femoral head was then removed. Retractors were placed  around the acetabulum and then circumferential removal of the labrum was  performed. Osteophytes were also removed. Reaming starts at 47 mm to  medialize and  Increased in 2 mm increments to 51 mm. We reamed in  approximately 40 degrees of abduction, 20 degrees anteversion. A 52 mm  pinnacle acetabular shell was then impacted in anatomic position under  fluoroscopic guidance with excellent purchase. We did not need to place  any additional dome screws. A 32 mm neutral + 4 marathon liner was then  placed into the acetabular shell.       The femoral lift was then placed along the lateral aspect of the femur  just distal to the vastus ridge. The leg was  externally rotated and capsule  was stripped off the inferior aspect of the femoral neck down to the  level of the lesser trochanter, this was done with electrocautery. The femur was lifted after this was performed. The  leg was then placed in an extended and adducted position essentially delivering the femur. We also removed the capsule superiorly and the piriformis from the piriformis fossa  to gain excellent exposure of the  proximal femur. Rongeur was used to remove some cancellous bone to get  into the lateral portion of the proximal femur for placement of the  initial starter reamer. The starter broaches was placed  the starter broach  and was shown to go down the center of the canal. Broaching  with the Actis system was then performed starting at size 0  coursing  Up to size 4. A size 4 had excellent torsional and rotational  and axial stability. The trial high offset neck was then placed   with a 32 + 1 trial head. The hip was then reduced. We confirmed that  the stem was in the canal both on AP and lateral x-rays. It also has excellent sizing. The hip was reduced with outstanding stability through full extension and full external rotation.. AP pelvis was taken and the leg lengths were measured and found to be equal. Hip was then dislocated again and the femoral head and neck removed. The  femoral broach was removed. Size 4 Actis stem with a high offset  neck was then impacted into the femur following native anteversion. Has  excellent purchase in the canal. Excellent torsional and rotational and  axial stability. It is confirmed to be in the canal on AP and lateral  fluoroscopic views. The 32 + 1 metal head was placed and the hip  reduced with outstanding stability. Again AP pelvis was taken and it  confirmed that the leg lengths were equal. The wound was then copiously  irrigated with saline solution and the capsule reattached and repaired  with Ethibond suture. 30 ml of .25% Bupivicaine was  injected into the capsule and into the edge of the tensor fascia lata as well as subcutaneous tissue. The fascia overlying the tensor fascia lata was then closed with a running #1 V-Loc. Subcu was closed with interrupted 2-0 Vicryl and subcuticular running 4-0 Monocryl. Incision was cleaned  and dried. Steri-Strips and a bulky sterile dressing applied. The patient was awakened and transported to  recovery in stable condition.        Please note that a surgical assistant was a medical necessity for this procedure to perform it in a safe and expeditious manner. Assistant was necessary to provide appropriate retraction of vital neurovascular structures and to prevent femoral fracture and allow for anatomic placement of the prosthesis.  Gaynelle Arabian, M.D.

## 2021-04-27 NOTE — Transfer of Care (Signed)
Immediate Anesthesia Transfer of Care Note  Patient: Mj Willis  Procedure(s) Performed: Procedure(s): TOTAL HIP ARTHROPLASTY ANTERIOR APPROACH (Right)  Patient Location: PACU  Anesthesia Type:MAC and Spinal  Level of Consciousness: Patient easily awoken, comfortable, cooperative, following commands, responds to stimulation.   Airway & Oxygen Therapy: Patient spontaneously breathing, ventilating well, oxygen via simple oxygen mask.  Post-op Assessment: Report given to PACU RN, vital signs reviewed and stable.   Post vital signs: Reviewed and stable.  Complications: No apparent anesthesia complications  Last Vitals:  Vitals Value Taken Time  BP 109/68 04/27/21 1010  Temp    Pulse 61 04/27/21 1012  Resp 21 04/27/21 1012  SpO2 100 % 04/27/21 1012  Vitals shown include unvalidated device data.  Last Pain:  Vitals:   04/27/21 0654  TempSrc:   PainSc: 0-No pain      Patients Stated Pain Goal: 3 (78/97/84 7841)  Complications: No notable events documented.

## 2021-04-28 DIAGNOSIS — M1611 Unilateral primary osteoarthritis, right hip: Secondary | ICD-10-CM | POA: Diagnosis not present

## 2021-04-28 LAB — CBC
HCT: 38.8 % — ABNORMAL LOW (ref 39.0–52.0)
Hemoglobin: 13.2 g/dL (ref 13.0–17.0)
MCH: 30.2 pg (ref 26.0–34.0)
MCHC: 34 g/dL (ref 30.0–36.0)
MCV: 88.8 fL (ref 80.0–100.0)
Platelets: 223 10*3/uL (ref 150–400)
RBC: 4.37 MIL/uL (ref 4.22–5.81)
RDW: 13.4 % (ref 11.5–15.5)
WBC: 6.3 10*3/uL (ref 4.0–10.5)
nRBC: 0 % (ref 0.0–0.2)

## 2021-04-28 LAB — BASIC METABOLIC PANEL
Anion gap: 7 (ref 5–15)
BUN: 13 mg/dL (ref 8–23)
CO2: 26 mmol/L (ref 22–32)
Calcium: 8.7 mg/dL — ABNORMAL LOW (ref 8.9–10.3)
Chloride: 101 mmol/L (ref 98–111)
Creatinine, Ser: 0.76 mg/dL (ref 0.61–1.24)
GFR, Estimated: >60 mL/min (ref >60)
Glucose, Bld: 138 mg/dL — ABNORMAL HIGH (ref 70–99)
Potassium: 4.5 mmol/L (ref 3.5–5.1)
Sodium: 134 mmol/L — ABNORMAL LOW (ref 135–145)

## 2021-04-28 MED ORDER — ASPIRIN 325 MG PO TBEC: 325.0000 mg | DELAYED_RELEASE_TABLET | Freq: Two times a day (BID) | ORAL | 0 refills | Status: DC

## 2021-04-28 MED ORDER — HYDROCODONE-ACETAMINOPHEN 5-325 MG PO TABS: 1.0000 | ORAL_TABLET | Freq: Four times a day (QID) | ORAL | 0 refills | Status: DC | PRN

## 2021-04-28 MED ORDER — TRAMADOL HCL 50 MG PO TABS: 50.0000 mg | ORAL_TABLET | Freq: Four times a day (QID) | ORAL | 0 refills | Status: DC | PRN

## 2021-04-28 MED ORDER — METHOCARBAMOL 500 MG PO TABS
500.0000 mg | ORAL_TABLET | Freq: Four times a day (QID) | ORAL | 0 refills | Status: DC | PRN
Start: 2021-04-28 — End: 2024-06-04

## 2021-04-28 NOTE — TOC Transition Note (Signed)
Transition of Care [facility]) - CM/SW Discharge Note  Patient Details  Name: Arthur Moreno MRN: [id number] Date of Birth: 1941/01/09  [facility]) CM/SW Contact:  Sherie Don, LCSW Phone Number: 04/28/2021, 9:54 AM  Clinical Narrative: Patient is expected to discharge home after working with PT. CSW met with patient to review discharge plan and needs. Patient will discharge home with a home exercise program (HEP). Patient will need a rolling walker. [employer] delivered rolling walker to patient's room. TOC signing off.  Final next level of care: Home/Self Care Barriers to Discharge: No Barriers Identified  Patient Goals and CMS Choice Patient states their goals for this hospitalization and ongoing recovery are:: Discharge home with HEP CMS Medicare.gov Compare Post Acute Care list provided to:: Patient Choice offered to / list presented to : Patient  Discharge Plan and Services         DME Arranged: Walker rolling DME Agency: [employer] Representative spoke with at DME Agency: Prearranged with orthopedist's office  Readmission Risk Interventions No flowsheet data found.

## 2021-04-28 NOTE — Progress Notes (Signed)
   Subjective: 1 Day Post-Op Procedure(s) (LRB): TOTAL HIP ARTHROPLASTY ANTERIOR APPROACH (Right) Patient reports pain as mild.   Patient seen in rounds by Dr. Wynelle Link. Patient is well, and has had no acute complaints or problems. Denies SOB, chest pain, or calf pain. No acute overnight events. Ambulated 12 feet with therapy yesterday. Will continue therapy today.     Objective: Vital signs in last 24 hours: Temp:  [97.6 F (36.4 C)-99.1 F (37.3 C)] 97.8 F (36.6 C) (10/20 0500) Pulse Rate:  [50-95] 85 (10/20 0500) Resp:  [11-19] 16 (10/20 0500) BP: (102-152)/(68-93) 143/80 (10/20 0500) SpO2:  [95 %-100 %] 97 % (10/20 0500) Weight:  [79.8 kg] 79.8 kg (10/19 1148)  Intake/Output from previous day:  Intake/Output Summary (Last 24 hours) at 04/28/2021 0747 Last data filed at 04/28/2021 0645 Gross per 24 hour  Intake 4341.41 ml  Output 4350 ml  Net -8.59 ml     Intake/Output this shift: No intake/output data recorded.  Labs: Recent Labs    04/28/21 0327  HGB 13.2   Recent Labs    04/28/21 0327  WBC 6.3  RBC 4.37  HCT 38.8*  PLT 223   Recent Labs    04/28/21 0327  NA 134*  K 4.5  CL 101  CO2 26  BUN 13  CREATININE 0.76  GLUCOSE 138*  CALCIUM 8.7*   No results for input(s): LABPT, INR in the last 72 hours.  Exam: General - Patient is Alert and Oriented Extremity - Neurologically intact Neurovascular intact Intact pulses distally Dorsiflexion/Plantar flexion intact Dressing - dressing C/D/I Motor Function - intact, moving foot and toes well on exam.   Past Medical History:  Diagnosis Date   Acid reflux    Hypertension    Low back pain    Osteoarthritis     Assessment/Plan: 1 Day Post-Op Procedure(s) (LRB): TOTAL HIP ARTHROPLASTY ANTERIOR APPROACH (Right) Principal Problem:   OA (osteoarthritis) of hip Active Problems:   Primary osteoarthritis of right hip  Estimated body mass index is 27.55 kg/m as calculated from the following:    Height as of this encounter: 5\' 7"  (1.702 m).   Weight as of this encounter: 79.8 kg. Up with therapy  DVT Prophylaxis - Aspirin Weight bearing as tolerated. Continue therapy.  Plan is to go Home after hospital stay.   Plan for 1-2 sessions with PT today, and if meeting goals, will plan for discharge this afternoon.   Patient to follow up in two weeks with Dr. Wynelle Link in clinic.   The PDMP database was reviewed today prior to any opioid medications being prescribed to this patient.Fenton Foy, [place], PA-C Orthopedic Surgery [phone] 04/28/2021, 7:47 AM

## 2021-04-28 NOTE — Progress Notes (Signed)
Physical Therapy Treatment Patient Details Name: Arthur Moreno MRN: [id number] DOB: 12-08-1940 Today's Date: 04/28/2021   History of Present Illness Patient is 80 y.o. male s/p Rt THA anterior approach on 04/27/21 with PMH significant for HTN, OA, low back pain.    PT Comments    Pt ambulated 120' with RW, no loss of balance. Stair training completed. He demonstrates good understanding of HEP. He is ready to DC home from a PT standpoint.     Recommendations for follow up therapy are one component of a multi-disciplinary discharge planning process, led by the attending physician.  Recommendations may be updated based on patient status, additional functional criteria and insurance authorization.  Follow Up Recommendations  Follow surgeon's recommendation for DC plan and follow-up therapies;Home health PT     Equipment Recommendations  Rolling walker with 5" wheels    Recommendations for Other Services       Precautions / Restrictions Precautions Precautions: Fall Restrictions Weight Bearing Restrictions: No     Mobility  Bed Mobility Overal bed mobility: Needs Assistance Bed Mobility: Supine to Sit     Supine to sit: Min assist     General bed mobility comments: light assist to bring Rt LE off EOB fully and to raise trunk. cues for sequencing and to scoot to EOB.    Transfers Overall transfer level: Needs assistance Equipment used: Rolling walker (2 wheeled) Transfers: Sit to/from Stand Sit to Stand: Min guard;From elevated surface         General transfer comment: cues for hand placement for power up.  Ambulation/Gait Ambulation/Gait assistance: Supervision Gait Distance (Feet): 120 Feet Assistive device: Rolling walker (2 wheeled) Gait Pattern/deviations: Step-to pattern;Decreased stride length;Decreased weight shift to right Gait velocity: decr   General Gait Details: VCs sequencing and positioning in RW, no loss of balance   Stairs Stairs:  Yes Stairs assistance: Min guard Stair Management: One rail Left;Step to pattern;Forwards Number of Stairs: 3 General stair comments: VCs sequencing   Wheelchair Mobility    Modified Rankin (Stroke Patients Only)       Balance Overall balance assessment: Needs assistance Sitting-balance support: Feet supported Sitting balance-Leahy Scale: Good     Standing balance support: During functional activity;Bilateral upper extremity supported Standing balance-Leahy Scale: Poor Standing balance comment: reliant on RW and external support of therapist                            Cognition Arousal/Alertness: Awake/alert Behavior During Therapy: WFL for tasks assessed/performed Overall Cognitive Status: Within Functional Limits for tasks assessed                                        Exercises Total Joint Exercises Ankle Circles/Pumps: AROM;Both;20 reps;Supine Quad Sets: AROM;Right;5 reps;Supine Short Arc Quad: AROM;Right;10 reps;Supine Heel Slides: AAROM;Right;10 reps;Supine Hip ABduction/ADduction: AAROM;Right;10 reps;Supine Long Arc Quad: AROM;Right;10 reps;Seated    General Comments        Pertinent Vitals/Pain Pain Score: 10-Worst pain ever Pain Location: Rt hip with activity Pain Descriptors / Indicators: Aching;Discomfort Pain Intervention(s): Limited activity within patient's tolerance;Monitored during session;Patient requesting pain meds-RN notified;Ice applied    Home Living                      Prior Function            PT Goals (current goals  can now be found in the care plan section) Acute Rehab PT Goals Patient Stated Goal: get back to golfing PT Goal Formulation: With patient Time For Goal Achievement: 05/04/21 Potential to Achieve Goals: Good Progress towards PT goals: Progressing toward goals    Frequency    7X/week      PT Plan Current plan remains appropriate    Co-evaluation               AM-PAC PT "6 Clicks" Mobility   Outcome Measure  Help needed turning from your back to your side while in a flat bed without using bedrails?: A Little Help needed moving from lying on your back to sitting on the side of a flat bed without using bedrails?: A Little Help needed moving to and from a bed to a chair (including a wheelchair)?: A Little Help needed standing up from a chair using your arms (e.g., wheelchair or bedside chair)?: A Little Help needed to walk in hospital room?: A Lot Help needed climbing 3-5 steps with a railing? : A Lot 6 Click Score: 16    End of Session Equipment Utilized During Treatment: Gait belt Activity Tolerance: Patient tolerated treatment well Patient left: in chair;with call bell/phone within reach;with chair alarm set Nurse Communication: Mobility status PT Visit Diagnosis: Muscle weakness (generalized) (M62.81);Difficulty in walking, not elsewhere classified (R26.2);Unsteadiness on feet (R26.81)     Time: 5621-3086 PT Time Calculation (min) (ACUTE ONLY): 24 min  Charges:  $Gait Training: 8-22 mins $Therapeutic Exercise: 8-22 mins                     Blondell Reveal Kistler PT 04/28/2021  Acute Rehabilitation Services Pager [phone] Office [phone]

## 2021-04-29 ENCOUNTER — Encounter (HOSPITAL_COMMUNITY): Payer: Self-pay | Admitting: Orthopedic Surgery

## 2021-05-02 NOTE — Discharge Summary (Signed)
Physician Discharge Summary   Patient ID: Arthur Moreno MRN: [id number] DOB/AGE: August 10, 1940 80 y.o.  Admit date: 04/27/2021 Discharge date: 04/28/2021  Primary Diagnosis: s/p Right THA   Admission Diagnoses:  Past Medical History:  Diagnosis Date   Acid reflux    Hypertension    Low back pain    Osteoarthritis    Discharge Diagnoses:   Principal Problem:   OA (osteoarthritis) of hip Active Problems:   Primary osteoarthritis of right hip  Estimated body mass index is 27.55 kg/m as calculated from the following:   Height as of this encounter: 5\' 7"  (1.702 m).   Weight as of this encounter: 79.8 kg.  Procedure:  Procedure(s) (LRB): TOTAL HIP ARTHROPLASTY ANTERIOR APPROACH (Right)   Consults: None  HPI: Arthur Moreno is a 80 y.o. male who has advanced end-  stage arthritis of their Right  hip with progressively worsening pain and  dysfunction.The patient has failed nonoperative management and presents for total hip arthroplasty.   Laboratory Data: Admission on 04/27/2021, Discharged on 04/28/2021  Component Date Value Ref Range Status   ABO/RH(D) 04/27/2021    Final                   Value:A POS Performed at [facility], [place] [address] [place], [place] [address]    WBC 04/28/2021 6.3  4.0 - 10.5 K/uL Final   RBC 04/28/2021 4.37  4.22 - 5.81 MIL/uL Final   Hemoglobin 04/28/2021 13.2  13.0 - 17.0 g/dL Final   HCT 04/28/2021 38.8 (A)  39.0 - 52.0 % Final   MCV 04/28/2021 88.8  80.0 - 100.0 fL Final   MCH 04/28/2021 30.2  26.0 - 34.0 pg Final   MCHC 04/28/2021 34.0  30.0 - 36.0 g/dL Final   RDW 04/28/2021 13.4  11.5 - 15.5 % Final   Platelets 04/28/2021 223  150 - 400 K/uL Final   nRBC 04/28/2021 0.0  0.0 - 0.2 % Final   Performed at [facility], [place] [address], [place], [place] [address]   Sodium 04/28/2021 134 (A)  135 - 145 mmol/L Final   Potassium 04/28/2021 4.5  3.5 - 5.1 mmol/L Final   Chloride 04/28/2021 101  98 - 111  mmol/L Final   CO2 04/28/2021 26  22 - 32 mmol/L Final   Glucose, Bld 04/28/2021 138 (A)  70 - 99 mg/dL Final   Glucose reference range applies only to samples taken after fasting for at least 8 hours.   BUN 04/28/2021 13  8 - 23 mg/dL Final   Creatinine, Ser 04/28/2021 0.76  0.61 - 1.24 mg/dL Final   Calcium 04/28/2021 8.7 (A)  8.9 - 10.3 mg/dL Final   GFR, Estimated 04/28/2021 >60  >60 mL/min Final   Comment: (NOTE) Calculated using the CKD-EPI Creatinine Equation (2021)    Anion gap 04/28/2021 7  5 - 15 Final   Performed at [facility], [place] [address] [place], [place] [address]  Orders Only on 04/25/2021  Component Date Value Ref Range Status   SARS Coronavirus 2 04/25/2021 RESULT: NEGATIVE   Final   Comment: RESULT: NEGATIVESARS-CoV-2 INTERPRETATION:A NEGATIVE  test result means that SARS-CoV-2 RNA was not present in the specimen above the limit of detection of this test. This does not preclude a possible SARS-CoV-2 infection and should not be used as the  sole basis for patient management decisions. Negative results must be combined with clinical observations, patient history, and epidemiological information. Optimum specimen  types and timing for peak viral levels during infections caused by SARS-CoV-2  have not been determined. Collection of multiple specimens or types of specimens may be necessary to detect virus. Improper specimen collection and handling, sequence variability under primers/probes, or organism present below the limit of detection may  lead to false negative results. Positive and negative predictive values of testing are highly dependent on prevalence. False negative test results are more likely when prevalence of disease is high.The expected result is NEGATIVE.Fact S                          heet for  Healthcare Providers: LocalChronicle.no Sheet for Patients: SalonLookup.es Reference Range  - Negative   Hospital Outpatient Visit on 04/20/2021  Component Date Value Ref Range Status   MRSA, PCR 04/20/2021 NEGATIVE  NEGATIVE Final   Staphylococcus aureus 04/20/2021 NEGATIVE  NEGATIVE Final   Comment: (NOTE) The Xpert SA Assay (FDA approved for NASAL specimens in patients 64 years of age and older), is one component of a comprehensive surveillance program. It is not intended to diagnose infection nor to guide or monitor treatment. Performed at [facility], [place] [address] [place], [place] [address]    WBC 04/20/2021 2.1 (A)  4.0 - 10.5 K/uL Final   RBC 04/20/2021 4.86  4.22 - 5.81 MIL/uL Final   Hemoglobin 04/20/2021 14.6  13.0 - 17.0 g/dL Final   HCT 04/20/2021 43.3  39.0 - 52.0 % Final   MCV 04/20/2021 89.1  80.0 - 100.0 fL Final   MCH 04/20/2021 30.0  26.0 - 34.0 pg Final   MCHC 04/20/2021 33.7  30.0 - 36.0 g/dL Final   RDW 04/20/2021 13.5  11.5 - 15.5 % Final   Platelets 04/20/2021 273  150 - 400 K/uL Final   nRBC 04/20/2021 0.0  0.0 - 0.2 % Final   Performed at [facility], [place] [address] [place], [place] [address]   Sodium 04/20/2021 139  135 - 145 mmol/L Final   Potassium 04/20/2021 4.6  3.5 - 5.1 mmol/L Final   Chloride 04/20/2021 104  98 - 111 mmol/L Final   CO2 04/20/2021 27  22 - 32 mmol/L Final   Glucose, Bld 04/20/2021 98  70 - 99 mg/dL Final   Glucose reference range applies only to samples taken after fasting for at least 8 hours.   BUN 04/20/2021 11  8 - 23 mg/dL Final   Creatinine, Ser 04/20/2021 1.06  0.61 - 1.24 mg/dL Final   Calcium 04/20/2021 9.4  8.9 - 10.3 mg/dL Final   Total Protein 04/20/2021 8.1  6.5 - 8.1 g/dL Final   Albumin 04/20/2021 4.3  3.5 - 5.0 g/dL Final   AST 04/20/2021 20  15 - 41 U/L Final   ALT 04/20/2021 9  0 - 44 U/L Final   Alkaline Phosphatase 04/20/2021 65  38 - 126 U/L Final   Total Bilirubin 04/20/2021 0.5  0.3 - 1.2 mg/dL Final   GFR, Estimated 04/20/2021 >60  >60 mL/min Final    Comment: (NOTE) Calculated using the CKD-EPI Creatinine Equation (2021)    Anion gap 04/20/2021 8  5 - 15 Final   Performed at [facility], [place] [address], [place], [place] [address]   ABO/RH(D) 04/20/2021 A POS   Final   Antibody Screen 04/20/2021 NEG   Final   Sample Expiration 04/20/2021 04/30/2021,2359   Final   Extend sample reason 04/20/2021  Final                   Value:NO TRANSFUSIONS OR PREGNANCY IN THE PAST 3 MONTHS Performed at [place] [address], [place], [place] [address]    Prothrombin Time 04/20/2021 13.7  11.4 - 15.2 seconds Final   INR 04/20/2021 1.1  0.8 - 1.2 Final   Comment: (NOTE) INR goal varies based on device and disease states. Performed at [facility], [place] [address] [place], [place] [address]      X-Rays:DG Pelvis Portable  Result Date: 04/27/2021 CLINICAL DATA:  80 year old male status post hip replacement. EXAM: PORTABLE PELVIS 1-2 VIEWS COMPARISON:  None. FINDINGS: Portable AP supine view at 1043 hours. Right bipolar hip arthroplasty. Hardware appears intact with normal AP alignment. No unexpected osseous changes. Mild regional postoperative soft tissue gas. Negative visible bowel gas pattern. IMPRESSION: Right bipolar hip arthroplasty with no adverse features. Electronically Signed   By: Genevie Ann M.D.   On: 04/27/2021 11:14   DG C-Arm 1-60 Min-No Report  Result Date: 04/27/2021 Fluoroscopy was utilized by the requesting physician.  No radiographic interpretation.   DG C-Arm 1-60 Min-No Report  Result Date: 04/27/2021 Fluoroscopy was utilized by the requesting physician.  No radiographic interpretation.   DG HIP UNILAT WITH PELVIS 1V RIGHT  Result Date: 04/27/2021 CLINICAL DATA:  80 year old male status post hip replacement. EXAM: DG HIP (WITH OR WITHOUT PELVIS) 1V RIGHT COMPARISON:  None. FINDINGS: Twelve intraoperative fluoroscopic AP spot views of the right hip and lower  pelvis. These images demonstrate a right side bipolar hip arthroplasty placement. No adverse features identified. FLUOROSCOPY TIME:  0 minutes 8 seconds IMPRESSION: Right side bipolar hip arthroplasty with no adverse features. Electronically Signed   By: Genevie Ann M.D.   On: 04/27/2021 11:15    EKG: Orders placed or performed during the hospital encounter of 10/30/20   EKG 12-Lead   EKG 12-Lead   EKG     Hospital Course: Arthur Moreno is a 80 y.o. who was admitted to [facility]. They were brought to the operating room on 04/27/2021 and underwent Procedure(s): [place].  Patient tolerated the procedure well and was later transferred to the recovery room and then to the orthopaedic floor for postoperative care. They were given PO and IV analgesics for pain control following their surgery. They were given 24 hours of postoperative antibiotics of  Anti-infectives (From admission, onward)    Start     Dose/Rate Route Frequency Ordered Stop   04/27/21 1400  ceFAZolin (ANCEF) IVPB 2g/100 mL premix        2 g 200 mL/hr over 30 Minutes Intravenous Every 6 hours 04/27/21 1147 04/27/21 2029   04/27/21 0615  ceFAZolin (ANCEF) IVPB 2g/100 mL premix        2 g 200 mL/hr over 30 Minutes Intravenous On call to O.R. 04/27/21 3154 04/27/21 0086      and started on DVT prophylaxis in the form of Aspirin and TED hose.   PT and OT were ordered for total joint protocol. Discharge planning consulted to help with postop disposition and equipment needs.  Patient had an uneventful night on the evening of surgery. They started to get up OOB with therapy on POD#1. Pt was seen during rounds and was ready to go home pending progress with therapy. He worked with therapy on POD #1 and was meeting goals. Pt was discharged to home later that day  in stable condition.  Diet: Regular diet Activity: WBAT Follow-up: in two weeks Disposition: Home Discharged Condition: good   Discharge  Instructions     Call MD / Call 911   Complete by: As directed    If you experience chest pain or shortness of breath, CALL 911 and be transported to the hospital emergency room.  If you develope a fever above 101 F, pus (white drainage) or increased drainage or redness at the wound, or calf pain, call your surgeon's office.   Change dressing   Complete by: As directed    You have an adhesive waterproof bandage over the incision. Leave this in place until your first follow-up appointment. Once you remove this you will not need to place another bandage.   Constipation Prevention   Complete by: As directed    Drink plenty of fluids.  Prune juice may be helpful.  You may use a stool softener, such as Colace (over the counter) 100 mg twice a day.  Use MiraLax (over the counter) for constipation as needed.   Diet - low sodium heart healthy   Complete by: As directed    Do not sit on low chairs, stoools or toilet seats, as it may be difficult to get up from low surfaces   Complete by: As directed    Driving restrictions   Complete by: As directed    No driving for two weeks   Post-operative opioid taper instructions:   Complete by: As directed    POST-OPERATIVE OPIOID TAPER INSTRUCTIONS: It is important to wean off of your opioid medication as soon as possible. If you do not need pain medication after your surgery it is ok to stop day one. Opioids include: Codeine, Hydrocodone(Norco, Vicodin), Oxycodone(Percocet, oxycontin) and hydromorphone amongst others.  Long term and even short term use of opiods can cause: Increased pain response Dependence Constipation Depression Respiratory depression And more.  Withdrawal symptoms can include Flu like symptoms Nausea, vomiting And more Techniques to manage these symptoms Hydrate well Eat regular healthy meals Stay active Use relaxation techniques(deep breathing, meditating, yoga) Do Not substitute Alcohol to help with tapering If you have  been on opioids for less than two weeks and do not have pain than it is ok to stop all together.  Plan to wean off of opioids This plan should start within one week post op of your joint replacement. Maintain the same interval or time between taking each dose and first decrease the dose.  Cut the total daily intake of opioids by one tablet each day Next start to increase the time between doses. The last dose that should be eliminated is the evening dose.      TED hose   Complete by: As directed    Use stockings (TED hose) for three weeks on both leg(s).  You may remove them at night for sleeping.   Weight bearing as tolerated   Complete by: As directed       Allergies as of 04/28/2021   No Known Allergies      Medication List     STOP taking these medications    ibuprofen 200 MG tablet Commonly known as: ADVIL       TAKE these medications    acetaminophen 500 MG tablet Commonly known as: TYLENOL Take 1,000 mg by mouth every 6 (six) hours as needed.   amLODipine 5 MG tablet Commonly known as: NORVASC Take 5 mg by mouth daily.   aspirin 325 MG EC tablet  Take 1 tablet (325 mg total) by mouth 2 (two) times daily. Then take one 81 mg aspirin once a day for three weeks. Then discontinue aspirin.   HYDROcodone-acetaminophen 5-325 MG tablet Commonly known as: NORCO/VICODIN Take 1-2 tablets by mouth every 6 (six) hours as needed for severe pain.   lisinopril 40 MG tablet Commonly known as: ZESTRIL Take 40 mg by mouth daily.   loratadine 10 MG tablet Commonly known as: CLARITIN Take 10 mg by mouth daily.   methocarbamol 500 MG tablet Commonly known as: ROBAXIN Take 1 tablet (500 mg total) by mouth every 6 (six) hours as needed for muscle spasms.   PRILOSEC PO Take 20 mg by mouth daily.   QC TUMERIC COMPLEX PO Take 1,000 mg by mouth 2 (two) times daily.   traMADol 50 MG tablet Commonly known as: ULTRAM Take 1-2 tablets (50-100 mg total) by mouth every 6  (six) hours as needed for moderate pain.   VITAMIN B1-B12 IM Inject into the muscle See admin instructions. 1 injections IM every 2 months.   VITAMIN D3 PO Take 1 capsule by mouth daily.               Discharge Care Instructions  (From admission, onward)           Start     Ordered   04/28/21 0000  Weight bearing as tolerated        04/28/21 0751   04/28/21 0000  Change dressing       Comments: You have an adhesive waterproof bandage over the incision. Leave this in place until your first follow-up appointment. Once you remove this you will not need to place another bandage.   04/28/21 0751            Follow-up Information     Gaynelle Arabian, MD. Schedule an appointment as soon as possible for a visit on 05/10/2021.   Specialty: Orthopedic Surgery Contact information: [address] [place] [address] [phone]                 Signed: Fenton Foy, MBA, PA-C Orthopedic Surgery 05/02/2021, 10:53 PM

## 2021-07-13 ENCOUNTER — Ambulatory Visit: Payer: Medicare PPO | Admitting: Physical Medicine and Rehabilitation

## 2021-07-29 ENCOUNTER — Telehealth: Payer: Self-pay | Admitting: Physical Medicine and Rehabilitation

## 2021-07-29 NOTE — Telephone Encounter (Signed)
Pt called requesting a call back to set an appt for back. Please call pt at [phone] or [phone]

## 2021-08-08 ENCOUNTER — Encounter: Payer: Self-pay | Admitting: Physical Medicine and Rehabilitation

## 2021-08-08 ENCOUNTER — Ambulatory Visit: Payer: Medicare PPO | Admitting: Physical Medicine and Rehabilitation

## 2021-08-08 ENCOUNTER — Other Ambulatory Visit: Payer: Self-pay

## 2021-08-08 VITALS — BP 136/84 | HR 76

## 2021-08-08 DIAGNOSIS — M545 Low back pain, unspecified: Secondary | ICD-10-CM | POA: Diagnosis not present

## 2021-08-08 DIAGNOSIS — G8929 Other chronic pain: Secondary | ICD-10-CM

## 2021-08-08 DIAGNOSIS — M47816 Spondylosis without myelopathy or radiculopathy, lumbar region: Secondary | ICD-10-CM

## 2021-08-08 NOTE — Progress Notes (Signed)
Pt state lower back pain. Pt state standing makes the pain worse. Pt state he takes over the counter pain meds to help ease his pain.  Numeric Pain Rating Scale and Functional Assessment Average Pain 8 Pain Right Now 4 My pain is constant, dull, and aching Pain is worse with: standing Pain improves with: medication and injections   In the last MONTH (on 0-10 scale) has pain interfered with the following?  1. General activity like being  able to carry out your everyday physical activities such as walking, climbing stairs, carrying groceries, or moving a chair?  Rating(7)  2. Relation with others like being able to carry out your usual social activities and roles such as  activities at home, at work and in your community. Rating(8)  3. Enjoyment of life such that you have  been bothered by emotional problems such as feeling anxious, depressed or irritable?  Rating(9)

## 2021-08-08 NOTE — Progress Notes (Signed)
Arthur Moreno - 81 y.o. male MRN [id number]  Date of birth: 11-26-1940  Office Visit Note: Visit Date: 08/08/2021 [facility]: [facility] Referred by: [facility]  Subjective: Chief Complaint  Patient presents with   Lower Back - Pain   HPI: Arthur Moreno is a 81 y.o. male who comes in today for evaluation of chronic, worsening and severe bilateral lower back pain. Patient reports pain has been ongoing for several years. Patient states his pain is exacerbated by sitting and bending, describes pain as constant soreness, currently rates as 8 out of 10. Patient reports some relief of pain with home exercise regimen, rest and use of Tylenol as needed for pain. Patient also reports history of formal physical therapy and chiropractic treatments with some relief of pain. Patients lumbar x-rays from 2021 exhibit multi-level facet hypertrophy, most severe at L5-S1 where foraminal narrowing is also present. Patient had bilateral L3-L4, L4-L5, and L5-S1 radiofrequency ablation on 07/13/2020 and does reports significant and sustained relief of pain until recently. Patient states he did have right total hip arthoplasty by Dr. Gaynelle Arabian in October of 2022 and reports he is doing well after this procedure. Patient states he is a very active person and reports difficulty performing daily tasks due to increased pain. Patient also reports he plays golf frequently, but is has not been playing over the past several weeks as this activity seems to make his pain worse. Patient states he would like to discuss repeating radiofrequency ablation if possible. Patient denies focal weakness, numbness and tingling. Patient denies recent trauma or falls.   Review of Systems  Musculoskeletal:  Positive for back pain.  Neurological:  Negative for tingling, sensory change, focal weakness and weakness.  All other systems reviewed and are negative. Otherwise per HPI.  Assessment & Plan: Visit Diagnoses:     ICD-10-CM   1. Spondylosis without myelopathy or radiculopathy, lumbar region  M47.816 Ambulatory referral to Physical Medicine Rehab    2. Chronic bilateral low back pain without sciatica  M54.50 Ambulatory referral to Physical Medicine Rehab   G89.29     3. Facet hypertrophy of lumbar region  M47.816        Plan: Findings:  Chronic, worsening and severe bilateral axial back pain.  Patient continues to have excruciating and debilitating pain despite good conservative therapies such as formal physical therapy, chiropractic treatments, rest and use of medications.  Patient's clinical presentation and exam are consistent with facet mediated pain.  Patient did get significant (more than 60%) and sustained relief of pain with previous radiofrequency ablation.  We feel the next step is to repeat bilateral L3-L4, L4-L5 and L5-S1 radiofrequency ablation under fluoroscopic guidance.  If patient does not get good relief of pain with radiofrequency ablation we would consider obtaining lumbar MRI imaging.  We would also consider regrouping with our in-house physical therapy team.  Patient encouraged to remain active and to continue home exercises as tolerated.  No red flag symptoms noted upon exam today.   Meds & Orders: No orders of the defined types were placed in this encounter.   Orders Placed This Encounter  Procedures   Ambulatory referral to Physical Medicine Rehab    Follow-up: Return for Bilateral L3-L4, L4-L5 and L5-S1 radiofrequency ablation.   Procedures: No procedures performed      Clinical History: No specialty comments available.   He reports that he quit smoking about 10 years ago. His smoking use included cigarettes. He has never used smokeless tobacco.  No results for input(s): HGBA1C, LABURIC in the last 8760 hours.  Objective:  VS:  HT:     WT:    BMI:      BP:136/84   HR:76bpm   TEMP: ( )   RESP:  Physical Exam Vitals and nursing note reviewed.  HENT:     Head:  Normocephalic and atraumatic.     Right Ear: External ear normal.     Left Ear: External ear normal.     Nose: Nose normal.     Mouth/Throat:     Mouth: Mucous membranes are moist.  Eyes:     Extraocular Movements: Extraocular movements intact.  Cardiovascular:     Rate and Rhythm: Normal rate.     Pulses: Normal pulses.  Pulmonary:     Effort: Pulmonary effort is normal.  Abdominal:     General: Abdomen is flat. There is no distension.  Musculoskeletal:        General: Tenderness present.     Cervical back: Normal range of motion.     Comments: Pt is slow to rises from seated position to standing. Concordant low back pain with facet loading, lumbar spine extension and rotation. Strong distal strength without clonus, no pain upon palpation of greater trochanters. No pain noted upon internal/external rotation of bilateral hips. Sensation intact bilaterally. Walks independently, gait steady.     Skin:    General: Skin is warm and dry.     Capillary Refill: Capillary refill takes less than 2 seconds.  Neurological:     General: No focal deficit present.     Mental Status: He is alert and oriented to person, place, and time.  Psychiatric:        Mood and Affect: Mood normal.    Ortho Exam  Imaging: No results found.  Past Medical/Family/Surgical/Social History: Medications & Allergies reviewed per EMR, new medications updated. Patient Active Problem List   Diagnosis Date Noted   Primary osteoarthritis of right hip 04/27/2021   Appendicitis, acute 10/31/2020   Acute appendicitis 10/30/2020   Abdominal pain 10/30/2020   Cavernous hemangioma 10/30/2020   Essential hypertension 10/30/2020   Hyperlipidemia 10/30/2020   GERD (gastroesophageal reflux disease) 10/30/2020   OA (osteoarthritis) of hip 10/30/2020   Hyponatremia 10/30/2020   Pain in right hand 09/09/2019   Past Medical History:  Diagnosis Date   Acid reflux    Hypertension    Low back pain    Osteoarthritis     History reviewed. No pertinent family history. Past Surgical History:  Procedure Laterality Date   HEMORRHOID SURGERY     At the age of 47yrs   LAPAROSCOPIC APPENDECTOMY N/A 10/31/2020   Procedure: APPENDECTOMY LAPAROSCOPIC;  Surgeon: Aviva Signs, MD;  Location: [facility];  Service: General;  Laterality: N/A;   TOTAL HIP ARTHROPLASTY Right 04/27/2021   Procedure: TOTAL HIP ARTHROPLASTY ANTERIOR APPROACH;  Surgeon: Gaynelle Arabian, MD;  Location: [facility];  Service: Orthopedics;  Laterality: Right;   Social History   Occupational History   Not on file  Tobacco Use   Smoking status: Former    Types: Cigarettes    Quit date: 06/07/2011    Years since quitting: 10.1   Smokeless tobacco: Never  Vaping Use   Vaping Use: Never used  Substance and Sexual Activity   Alcohol use: Yes    Comment: occas.   Drug use: Never   Sexual activity: Not on file

## 2021-08-29 DIAGNOSIS — M6530 Trigger finger, unspecified finger: Secondary | ICD-10-CM | POA: Insufficient documentation

## 2021-09-14 ENCOUNTER — Other Ambulatory Visit: Payer: Self-pay

## 2021-09-14 ENCOUNTER — Ambulatory Visit: Payer: Self-pay

## 2021-09-14 ENCOUNTER — Ambulatory Visit: Payer: Medicare PPO | Admitting: Physical Medicine and Rehabilitation

## 2021-09-14 ENCOUNTER — Encounter: Payer: Self-pay | Admitting: Physical Medicine and Rehabilitation

## 2021-09-14 VITALS — BP 110/74 | HR 69

## 2021-09-14 DIAGNOSIS — M47816 Spondylosis without myelopathy or radiculopathy, lumbar region: Secondary | ICD-10-CM

## 2021-09-14 MED ORDER — METHYLPREDNISOLONE ACETATE 80 MG/ML IJ SUSP
80.0000 mg | Freq: Once | INTRAMUSCULAR | Status: AC
  Administered 2021-09-14: 80 mg

## 2021-09-14 NOTE — Patient Instructions (Signed)

## 2021-09-14 NOTE — Progress Notes (Signed)
Pt state lower back pain. Pt state standing makes the pain worse. Pt state he takes over the counter pain meds to help ease his pain. ? ?Numeric Pain Rating Scale and Functional Assessment ?Average Pain 4 ? ? ?In the last MONTH (on 0-10 scale) has pain interfered with the following? ? ?1. General activity like being  able to carry out your everyday physical activities such as walking, climbing stairs, carrying groceries, or moving a chair?  ?Rating(8) ? ? ?+Driver, -BT, -Dye Allergies. ? ?

## 2021-09-21 ENCOUNTER — Encounter: Payer: Self-pay | Admitting: Physical Medicine and Rehabilitation

## 2021-09-21 ENCOUNTER — Ambulatory Visit: Payer: Self-pay

## 2021-09-21 ENCOUNTER — Ambulatory Visit: Payer: Medicare PPO | Admitting: Physical Medicine and Rehabilitation

## 2021-09-21 ENCOUNTER — Other Ambulatory Visit: Payer: Self-pay

## 2021-09-21 VITALS — BP 134/81 | HR 74

## 2021-09-21 DIAGNOSIS — M47816 Spondylosis without myelopathy or radiculopathy, lumbar region: Secondary | ICD-10-CM

## 2021-09-21 MED ORDER — METHYLPREDNISOLONE ACETATE 80 MG/ML IJ SUSP
80.0000 mg | Freq: Once | INTRAMUSCULAR | Status: AC
  Administered 2021-09-21: 80 mg

## 2021-09-21 NOTE — Progress Notes (Signed)
Pt state lower back pain. Pt state standing makes the pain worse. Pt state he takes over the counter pain meds to help ease his pain. ? ?Numeric Pain Rating Scale and Functional Assessment ?Average Pain 2 ? ? ?In the last MONTH (on 0-10 scale) has pain interfered with the following? ? ?1. General activity like being  able to carry out your everyday physical activities such as walking, climbing stairs, carrying groceries, or moving a chair?  ?Rating(7) ? ? ?+Driver, -BT, -Dye Allergies. ? ?

## 2021-09-21 NOTE — Patient Instructions (Signed)

## 2021-09-22 NOTE — Progress Notes (Signed)
? ?Arthur Moreno - 81 y.o. male MRN [id number]  Date of birth: [birth year] ? ?Office Visit Note: ?Visit Date: 09/21/2021 ?[facility]P: [facility] ?Referred by: [facility] ? ?Subjective: ?Chief Complaint  ?Patient presents with  ? Lower Back - Pain  ? ?HPI:  Arthur Moreno is a 81 y.o. male who comes in todayfor planned repeat radiofrequency ablation of the Left L3-4, L4-5, L5-S1, and L4-L5  Lumbar facet joints. This would be ablation of the corresponding medial branches and/or dorsal rami.  Patient has had double diagnostic blocks with more than 70% relief.  Subsequent ablation gave them more than 6 months of over 60% relief.  They have had chronic back pain for quite some time, more than 3 months, which has been an ongoing situation with recalcitrant axial back pain.  They have no radicular pain.  Their axial pain is worse with standing and ambulating and on exam today with facet loading.  They have had physical therapy as well as home exercise program.  The imaging noted in the chart below indicated facet pathology. Accordingly they meet all the criteria and qualification for for radiofrequency ablation and we are going to complete this today hopefully for more longer term relief as part of comprehensive management program.  ? ?ROS Otherwise per HPI. ? ?Assessment & Plan: ?Visit Diagnoses:  ?  ICD-10-CM   ?1. Spondylosis without myelopathy or radiculopathy, lumbar region  M47.816 XR C-ARM NO REPORT  ?  Radiofrequency,Lumbar  ?  methylPREDNISolone acetate (DEPO-MEDROL) injection 80 mg  ?  ?  ?Plan: No additional findings.  ? ?Meds & Orders:  ?Meds ordered this encounter  ?Medications  ? methylPREDNISolone acetate (DEPO-MEDROL) injection 80 mg  ?  ?Orders Placed This Encounter  ?Procedures  ? Radiofrequency,Lumbar  ? XR C-ARM NO REPORT  ?  ?Follow-up: Return if symptoms worsen or fail to improve.  ? ?Procedures: ?No procedures performed  ?Lumbar Facet Joint Nerve Denervation ? ?Patient: Arthur Moreno      ?Date of Birth: 1940/12/08 ?MRN: [id number] ?PCP: [facility]      ?Visit Date: 09/21/2021 ?  ?Universal Protocol:    ?Date/Time: 03/16/235:49 AM ? ?Consent Given By: the patient ? ?Position: PRONE ? ?Additional Comments: ?Vital signs were monitored before and after the procedure. ?Patient was prepped and draped in the usual sterile fashion. ?The correct patient, procedure, and site was verified. ? ? ?Injection Procedure Details:  ? ?Procedure diagnoses:  ?1. Spondylosis without myelopathy or radiculopathy, lumbar region   ?  ? ?Meds Administered:  ?Meds ordered this encounter  ?Medications  ? methylPREDNISolone acetate (DEPO-MEDROL) injection 80 mg  ?  ? ?Laterality: Left ? ?Location/Site:  L3-L4, L2 and L3 medial branches, L4-L5, L3 and L4 medial branches, and L5-S1, L4 medial branch and L5 dorsal ramus ? ?Needle: 18 ga.,  7m active tip, 108mRF Cannula ? ?Needle Placement: Along juncture of superior articular process and transverse pocess ? ?Findings: ? -Comments: ? ?Procedure Details: ?For each desired target nerve, the corresponding transverse process (sacral ala for the L5 dorsal rami) was identified and the fluoroscope was positioned to square off the endplates of the corresponding vertebral body to achieve a true AP midline view.  The beam was then obliqued 15 to 20 degrees and caudally tilted 15 to 20 degrees to line up a trajectory along the target nerves. The skin over the target of the junction of superior articulating process and transverse process (sacral ala for the L5 dorsal rami) was infiltrated with  37m of 1% Lidocaine without Epinephrine.  The 18 gauge 154mactive tip outer cannula was advanced in trajectory view to the target. ? ?This procedure was repeated for each target nerve.  Then, for all levels, the outer cannula placement was fine-tuned and the position was then confirmed with bi-planar imaging.   ? ?Test stimulation was done both at sensory and motor levels to  ensure there was no radicular stimulation. The target tissues were then infiltrated with 1 ml of 1% Lidocaine without Epinephrine. Subsequently, a percutaneous neurotomy was carried out for 90 seconds at 80 degrees Celsius.  After the completion of the lesion, 1 ml of injectate was delivered. It was then repeated for each facet joint nerve mentioned above. Appropriate radiographs were obtained to verify the probe placement during the neurotomy. ? ? ?Additional Comments:  ?The patient tolerated the procedure well ?Dressing: 2 x 2 sterile gauze and Band-Aid ?  ? ?Post-procedure details: ?Patient was observed during the procedure. ?Post-procedure instructions were reviewed. ? ?Patient left the clinic in stable condition. ? ? ?   ? ?Clinical History: ?No specialty comments available.  ? ? ? ?Objective:  VS:  HT:    WT:   BMI:     BP:134/81  HR:74bpm  TEMP: ( )  RESP:  ?Physical Exam ?Vitals and nursing note reviewed.  ?Constitutional:   ?   General: He is not in acute distress. ?   Appearance: Normal appearance. He is not ill-appearing.  ?HENT:  ?   Head: Normocephalic and atraumatic.  ?   Right Ear: External ear normal.  ?   Left Ear: External ear normal.  ?   Nose: No congestion.  ?Eyes:  ?   Extraocular Movements: Extraocular movements intact.  ?Cardiovascular:  ?   Rate and Rhythm: Normal rate.  ?   Pulses: Normal pulses.  ?Pulmonary:  ?   Effort: Pulmonary effort is normal. No respiratory distress.  ?Abdominal:  ?   General: There is no distension.  ?   Palpations: Abdomen is soft.  ?Musculoskeletal:     ?   General: No tenderness or signs of injury.  ?   Cervical back: Neck supple.  ?   Right lower leg: No edema.  ?   Left lower leg: No edema.  ?   Comments: Patient has good distal strength without clonus. Patient somewhat slow to rise from a seated position to full extension.  There is concordant low back pain with facet loading and lumbar spine extension rotation.  There are no definitive trigger points  but the patient is somewhat tender across the lower back and PSIS.  There is no pain with hip rotation.   ?Skin: ?   Findings: No erythema or rash.  ?Neurological:  ?   General: No focal deficit present.  ?   Mental Status: He is alert and oriented to person, place, and time.  ?   Sensory: No sensory deficit.  ?   Motor: No weakness or abnormal muscle tone.  ?   Coordination: Coordination normal.  ?Psychiatric:     ?   Mood and Affect: Mood normal.     ?   Behavior: Behavior normal.  ?  ? ?Imaging: ?XR C-ARM NO REPORT ? ?Result Date: 09/21/2021 ?Please see Notes tab for imaging impression.  ?

## 2021-09-22 NOTE — Procedures (Signed)
Lumbar Facet Joint Nerve Denervation ? ?Patient: Arthur Moreno      ?Date of Birth: 05-21-1941 ?MRN: [id number] ?PCP: [facility]      ?Visit Date: 09/21/2021 ?  ?Universal Protocol:    ?Date/Time: 03/16/235:49 AM ? ?Consent Given By: the patient ? ?Position: PRONE ? ?Additional Comments: ?Vital signs were monitored before and after the procedure. ?Patient was prepped and draped in the usual sterile fashion. ?The correct patient, procedure, and site was verified. ? ? ?Injection Procedure Details:  ? ?Procedure diagnoses:  ?1. Spondylosis without myelopathy or radiculopathy, lumbar region   ?  ? ?Meds Administered:  ?Meds ordered this encounter  ?Medications  ? methylPREDNISolone acetate (DEPO-MEDROL) injection 80 mg  ?  ? ?Laterality: Left ? ?Location/Site:  L3-L4, L2 and L3 medial branches, L4-L5, L3 and L4 medial branches, and L5-S1, L4 medial branch and L5 dorsal ramus ? ?Needle: 18 ga.,  55m active tip, 1033mRF Cannula ? ?Needle Placement: Along juncture of superior articular process and transverse pocess ? ?Findings: ? -Comments: ? ?Procedure Details: ?For each desired target nerve, the corresponding transverse process (sacral ala for the L5 dorsal rami) was identified and the fluoroscope was positioned to square off the endplates of the corresponding vertebral body to achieve a true AP midline view.  The beam was then obliqued 15 to 20 degrees and caudally tilted 15 to 20 degrees to line up a trajectory along the target nerves. The skin over the target of the junction of superior articulating process and transverse process (sacral ala for the L5 dorsal rami) was infiltrated with 13m80mf 1% Lidocaine without Epinephrine.  The 18 gauge 66m47mtive tip outer cannula was advanced in trajectory view to the target. ? ?This procedure was repeated for each target nerve.  Then, for all levels, the outer cannula placement was fine-tuned and the position was then confirmed with bi-planar imaging.   ? ?Test  stimulation was done both at sensory and motor levels to ensure there was no radicular stimulation. The target tissues were then infiltrated with 1 ml of 1% Lidocaine without Epinephrine. Subsequently, a percutaneous neurotomy was carried out for 90 seconds at 80 degrees Celsius.  After the completion of the lesion, 1 ml of injectate was delivered. It was then repeated for each facet joint nerve mentioned above. Appropriate radiographs were obtained to verify the probe placement during the neurotomy. ? ? ?Additional Comments:  ?The patient tolerated the procedure well ?Dressing: 2 x 2 sterile gauze and Band-Aid ?  ? ?Post-procedure details: ?Patient was observed during the procedure. ?Post-procedure instructions were reviewed. ? ?Patient left the clinic in stable condition. ? ? ?  ?

## 2021-09-22 NOTE — Procedures (Signed)
Lumbar Facet Joint Nerve Denervation ? ?Patient: Arthur Moreno      ?Date of Birth: 06/22/1941 ?MRN: [id number] ?PCP: [facility]      ?Visit Date: 09/14/2021 ?  ?Universal Protocol:    ?Date/Time: 03/16/235:47 AM ? ?Consent Given By: the patient ? ?Position: PRONE ? ?Additional Comments: ?Vital signs were monitored before and after the procedure. ?Patient was prepped and draped in the usual sterile fashion. ?The correct patient, procedure, and site was verified. ? ? ?Injection Procedure Details:  ? ?Procedure diagnoses:  ?1. Spondylosis without myelopathy or radiculopathy, lumbar region   ?  ? ?Meds Administered:  ?Meds ordered this encounter  ?Medications  ? methylPREDNISolone acetate (DEPO-MEDROL) injection 80 mg  ?  ? ?Laterality: Right ? ?Location/Site:  L3-L4, L2 and L3 medial branches, L4-L5, L3 and L4 medial branches, and L5-S1, L4 medial branch and L5 dorsal ramus ? ?Needle: 18 ga.,  38m active tip, 107mRF Cannula ? ?Needle Placement: Along juncture of superior articular process and transverse pocess ? ?Findings: ? -Comments: ? ?Procedure Details: ?For each desired target nerve, the corresponding transverse process (sacral ala for the L5 dorsal rami) was identified and the fluoroscope was positioned to square off the endplates of the corresponding vertebral body to achieve a true AP midline view.  The beam was then obliqued 15 to 20 degrees and caudally tilted 15 to 20 degrees to line up a trajectory along the target nerves. The skin over the target of the junction of superior articulating process and transverse process (sacral ala for the L5 dorsal rami) was infiltrated with 33m78mf 1% Lidocaine without Epinephrine.  The 18 gauge 36m52mtive tip outer cannula was advanced in trajectory view to the target. ? ?This procedure was repeated for each target nerve.  Then, for all levels, the outer cannula placement was fine-tuned and the position was then confirmed with bi-planar imaging.   ? ?Test  stimulation was done both at sensory and motor levels to ensure there was no radicular stimulation. The target tissues were then infiltrated with 1 ml of 1% Lidocaine without Epinephrine. Subsequently, a percutaneous neurotomy was carried out for 90 seconds at 80 degrees Celsius.  After the completion of the lesion, 1 ml of injectate was delivered. It was then repeated for each facet joint nerve mentioned above. Appropriate radiographs were obtained to verify the probe placement during the neurotomy. ? ? ?Additional Comments:  ?The patient tolerated the procedure well ?Dressing: 2 x 2 sterile gauze and Band-Aid ?  ? ?Post-procedure details: ?Patient was observed during the procedure. ?Post-procedure instructions were reviewed. ? ?Patient left the clinic in stable condition. ? ? ? ?

## 2021-09-22 NOTE — Progress Notes (Signed)
? ?Arthur Moreno - 81 y.o. male MRN [id number]  Date of birth: 11/02/1940 ? ?Office Visit Note: ?Visit Date: 09/14/2021 ?PCP: [facility] ?Referred by: [facility] ? ?Subjective: ?Chief Complaint  ?Patient presents with  ? Lower Back - Pain  ? ?HPI:  Arthur Moreno is a 81 y.o. male who comes in todayfor planned repeat radiofrequency ablation of the Right L3-4, L4-5, L5-S1, and L4-L5  Lumbar facet joints. This would be ablation of the corresponding medial branches and/or dorsal rami.  Patient has had double diagnostic blocks with more than 70% relief.  Subsequent ablation gave them more than 6 months of over 60% relief.  They have had chronic back pain for quite some time, more than 3 months, which has been an ongoing situation with recalcitrant axial back pain.  They have no radicular pain.  Their axial pain is worse with standing and ambulating and on exam today with facet loading.  They have had physical therapy as well as home exercise program.  The imaging noted in the chart below indicated facet pathology. Accordingly they meet all the criteria and qualification for for radiofrequency ablation and we are going to complete this today hopefully for more longer term relief as part of comprehensive management program.  ? ?ROS Otherwise per HPI. ? ?Assessment & Plan: ?Visit Diagnoses:  ?  ICD-10-CM   ?1. Spondylosis without myelopathy or radiculopathy, lumbar region  M47.816 XR C-ARM NO REPORT  ?  Radiofrequency,Lumbar  ?  methylPREDNISolone acetate (DEPO-MEDROL) injection 80 mg  ?  ?  ?Plan: No additional findings.  ? ?Meds & Orders:  ?Meds ordered this encounter  ?Medications  ? methylPREDNISolone acetate (DEPO-MEDROL) injection 80 mg  ?  ?Orders Placed This Encounter  ?Procedures  ? Radiofrequency,Lumbar  ? XR C-ARM NO REPORT  ?  ?Follow-up: Return if symptoms worsen or fail to improve.  ? ?Procedures: ?No procedures performed  ?Lumbar Facet Joint Nerve Denervation ? ?Patient: Arthur Moreno      ?Date of Birth: [birth year] ?MRN: [id number] ?[facility]P: [facility]      ?Visit Date: 09/14/2021 ?  ?Universal Protocol:    ?Date/Time: 03/16/235:47 AM ? ?Consent Given By: the patient ? ?Position: PRONE ? ?Additional Comments: ?Vital signs were monitored before and after the procedure. ?Patient was prepped and draped in the usual sterile fashion. ?The correct patient, procedure, and site was verified. ? ? ?Injection Procedure Details:  ? ?Procedure diagnoses:  ?1. Spondylosis without myelopathy or radiculopathy, lumbar region   ?  ? ?Meds Administered:  ?Meds ordered this encounter  ?Medications  ? methylPREDNISolone acetate (DEPO-MEDROL) injection 80 mg  ?  ? ?Laterality: Right ? ?Location/Site:  L3-L4, L2 and L3 medial branches, L4-L5, L3 and L4 medial branches, and L5-S1, L4 medial branch and L5 dorsal ramus ? ?Needle: 18 ga.,  54m active tip, 1089mRF Cannula ? ?Needle Placement: Along juncture of superior articular process and transverse pocess ? ?Findings: ? -Comments: ? ?Procedure Details: ?For each desired target nerve, the corresponding transverse process (sacral ala for the L5 dorsal rami) was identified and the fluoroscope was positioned to square off the endplates of the corresponding vertebral body to achieve a true AP midline view.  The beam was then obliqued 15 to 20 degrees and caudally tilted 15 to 20 degrees to line up a trajectory along the target nerves. The skin over the target of the junction of superior articulating process and transverse process (sacral ala for the L5 dorsal rami) was infiltrated with  57m of 1% Lidocaine without Epinephrine.  The 18 gauge 16mactive tip outer cannula was advanced in trajectory view to the target. ? ?This procedure was repeated for each target nerve.  Then, for all levels, the outer cannula placement was fine-tuned and the position was then confirmed with bi-planar imaging.   ? ?Test stimulation was done both at sensory and motor levels to  ensure there was no radicular stimulation. The target tissues were then infiltrated with 1 ml of 1% Lidocaine without Epinephrine. Subsequently, a percutaneous neurotomy was carried out for 90 seconds at 80 degrees Celsius.  After the completion of the lesion, 1 ml of injectate was delivered. It was then repeated for each facet joint nerve mentioned above. Appropriate radiographs were obtained to verify the probe placement during the neurotomy. ? ? ?Additional Comments:  ?The patient tolerated the procedure well ?Dressing: 2 x 2 sterile gauze and Band-Aid ?  ? ?Post-procedure details: ?Patient was observed during the procedure. ?Post-procedure instructions were reviewed. ? ?Patient left the clinic in stable condition. ? ? ?  ? ?Clinical History: ?No specialty comments available.  ? ? ? ?Objective:  VS:  HT:    WT:   BMI:     BP:110/74  HR:69bpm  TEMP: ( )  RESP:  ?Physical Exam ?Vitals and nursing note reviewed.  ?Constitutional:   ?   General: He is not in acute distress. ?   Appearance: Normal appearance. He is not ill-appearing.  ?HENT:  ?   Head: Normocephalic and atraumatic.  ?   Right Ear: External ear normal.  ?   Left Ear: External ear normal.  ?   Nose: No congestion.  ?Eyes:  ?   Extraocular Movements: Extraocular movements intact.  ?Cardiovascular:  ?   Rate and Rhythm: Normal rate.  ?   Pulses: Normal pulses.  ?Pulmonary:  ?   Effort: Pulmonary effort is normal. No respiratory distress.  ?Abdominal:  ?   General: There is no distension.  ?   Palpations: Abdomen is soft.  ?Musculoskeletal:     ?   General: No tenderness or signs of injury.  ?   Cervical back: Neck supple.  ?   Right lower leg: No edema.  ?   Left lower leg: No edema.  ?   Comments: Patient has good distal strength without clonus. Patient somewhat slow to rise from a seated position to full extension.  There is concordant low back pain with facet loading and lumbar spine extension rotation.  There are no definitive trigger points but  the patient is somewhat tender across the lower back and PSIS.  There is no pain with hip rotation.  ?Skin: ?   Findings: No erythema or rash.  ?Neurological:  ?   General: No focal deficit present.  ?   Mental Status: He is alert and oriented to person, place, and time.  ?   Sensory: No sensory deficit.  ?   Motor: No weakness or abnormal muscle tone.  ?   Coordination: Coordination normal.  ?Psychiatric:     ?   Mood and Affect: Mood normal.     ?   Behavior: Behavior normal.  ?  ? ?Imaging: ?XR C-ARM NO REPORT ? ?Result Date: 09/21/2021 ?Please see Notes tab for imaging impression.  ?

## 2022-07-13 IMAGING — CT CT ABD-PELV W/ CM
2 of 4 series · 16 of 46 positions shown, 18 images · IV contrast (Omnipaque or Isovue)
Comparison: None.

CLINICAL DATA: Right lower quadrant abdominal pain starting last
night.

EXAM:
CT ABDOMEN AND PELVIS WITH CONTRAST
TECHNIQUE: Multidetector CT imaging of the abdomen and pelvis was performed
using the standard protocol following bolus administration of
intravenous contrast.
CONTRAST:  100mL OMNIPAQUE IOHEXOL 300 MG/ML  SOLN

[Series 2: axial st · axial · 0.81mm/px · z∈[-306,+144]mm · 13 of 100 slices shown, 15 images]
[im 5/100  soft-tissue]
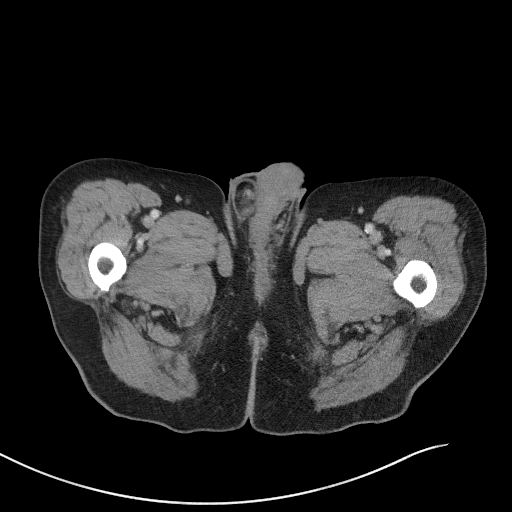
[im 5/100  bone]
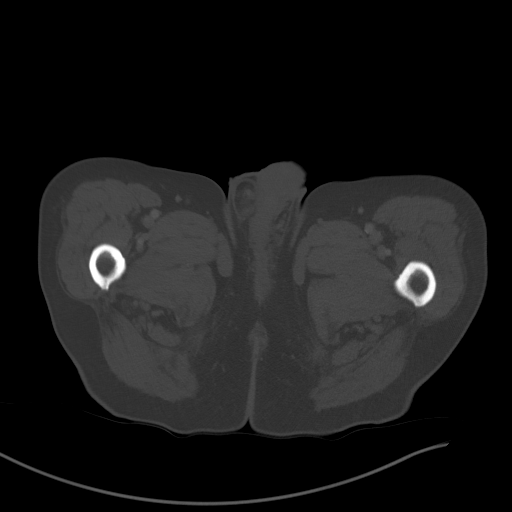
[im 13/100  soft-tissue]
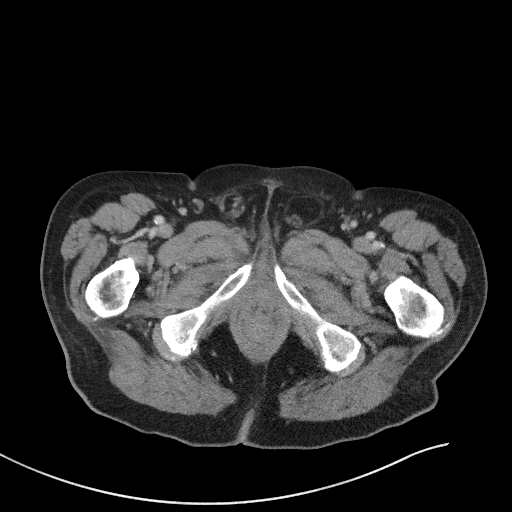
[im 21/100  soft-tissue]
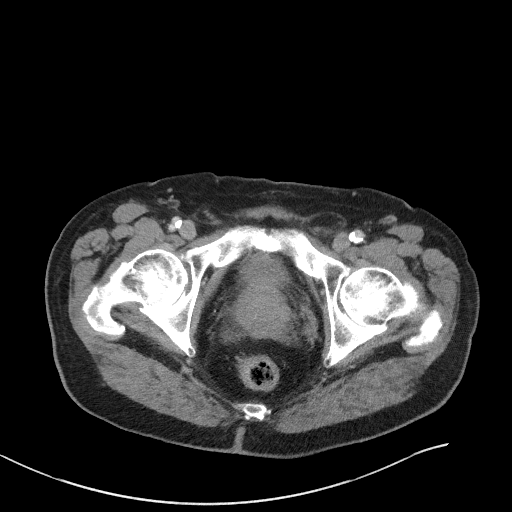
[im 29/100  soft-tissue]
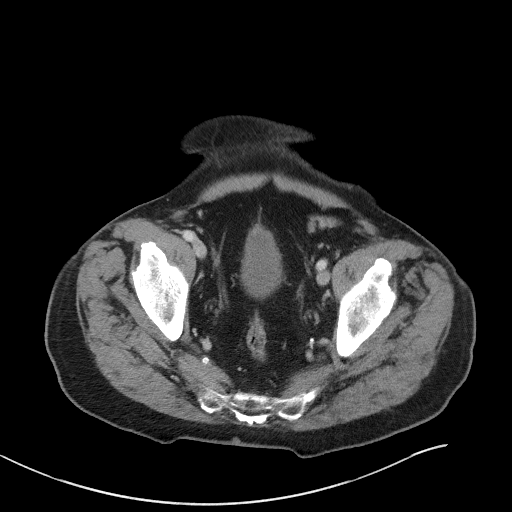
[im 34/100  soft-tissue]
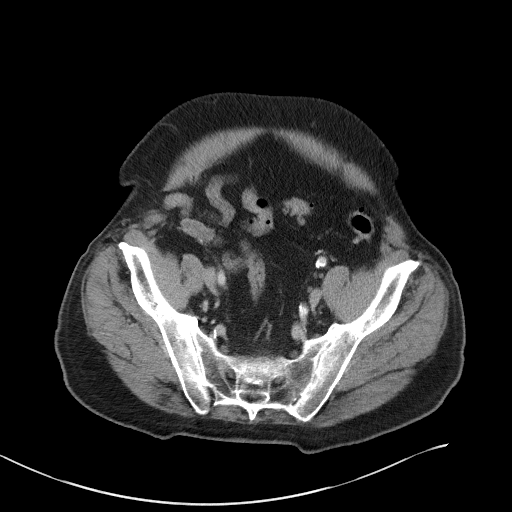
[im 42/100  soft-tissue]
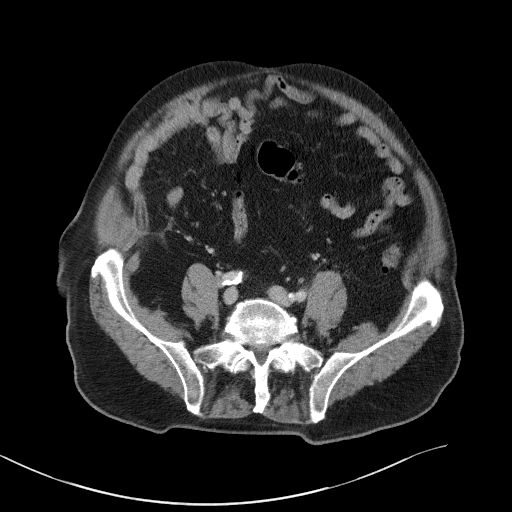
[im 50/100  soft-tissue]
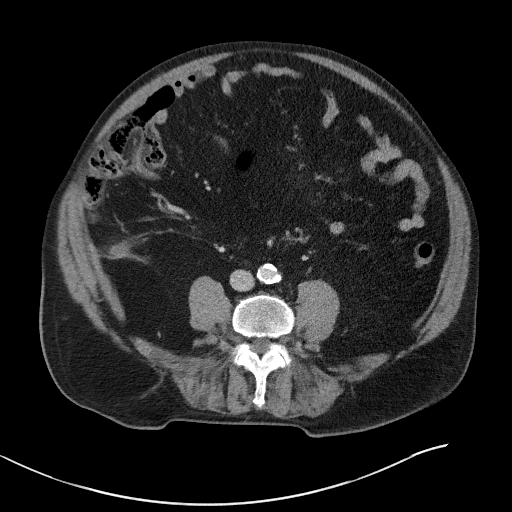
[im 58/100  soft-tissue]
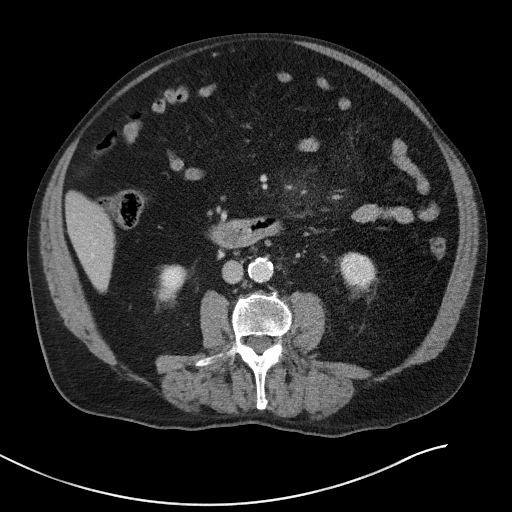
[im 67/100  soft-tissue]
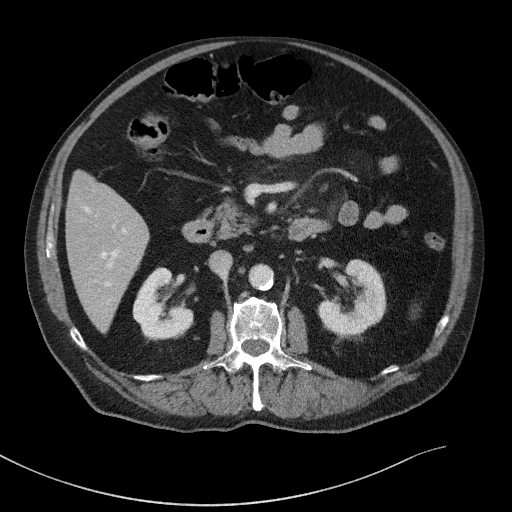
[im 67/100  bone]
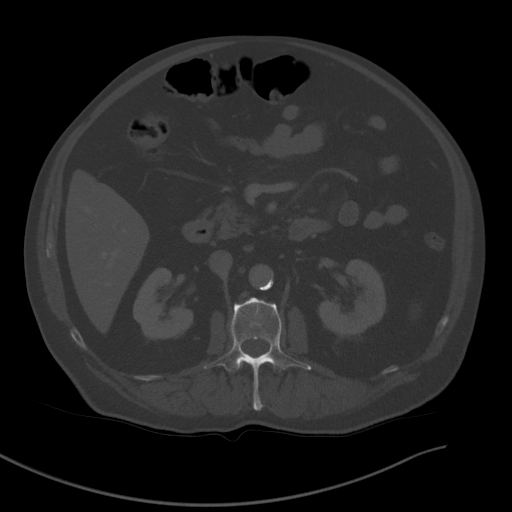
[im 71/100  soft-tissue]
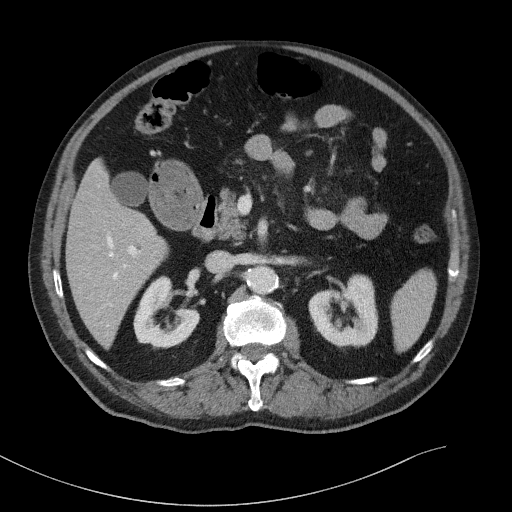
[im 79/100  soft-tissue]
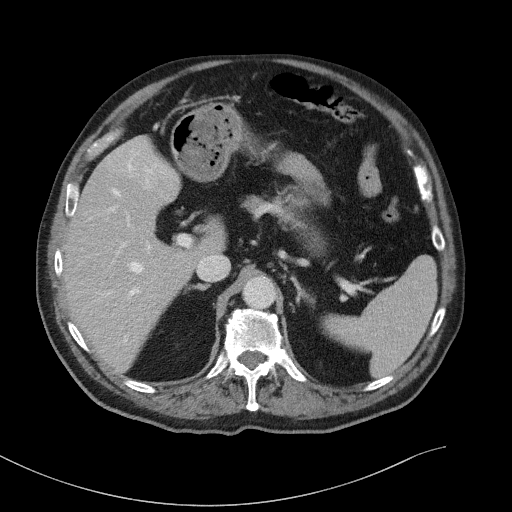
[im 87/100  soft-tissue]
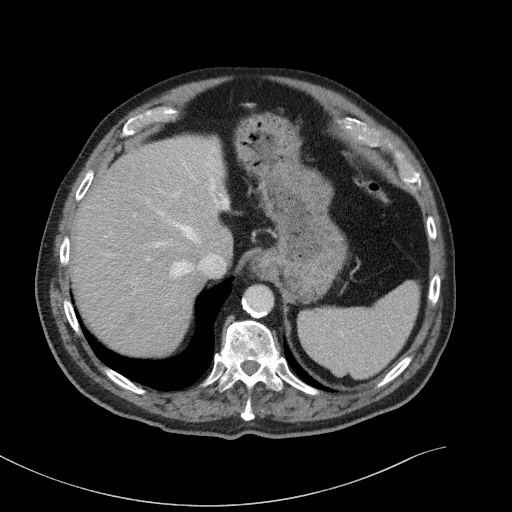
[im 95/100  soft-tissue]
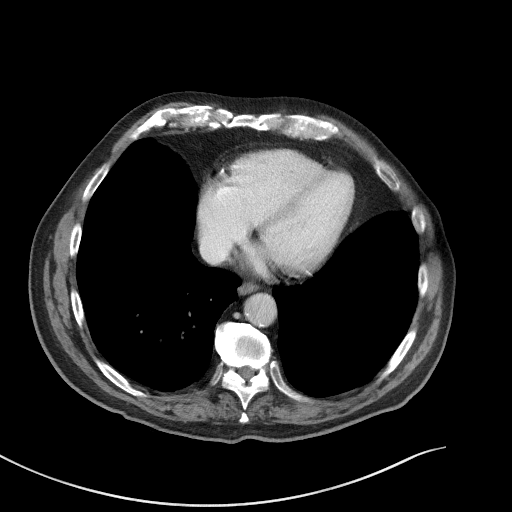

[Series 5: coronal st · coronal · 0.90mm/px · 3 of 121 slices shown]
[im 41/121  soft-tissue]
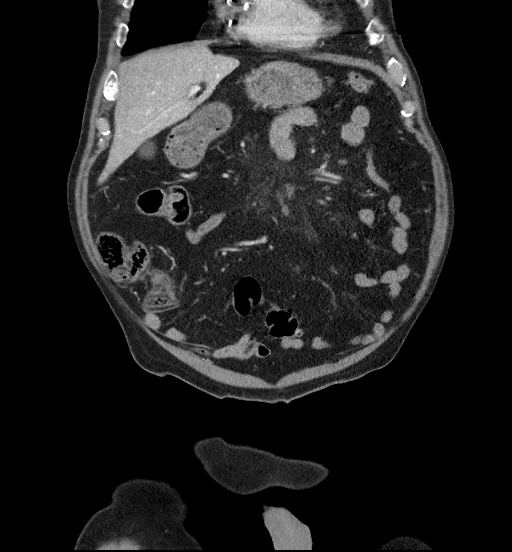
[im 54/121  soft-tissue]
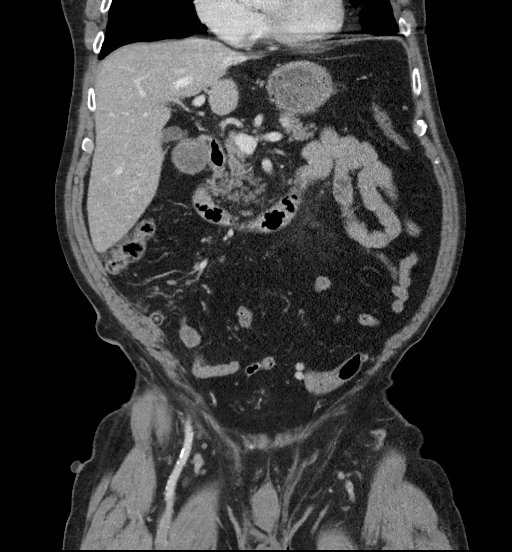
[im 67/121  soft-tissue]
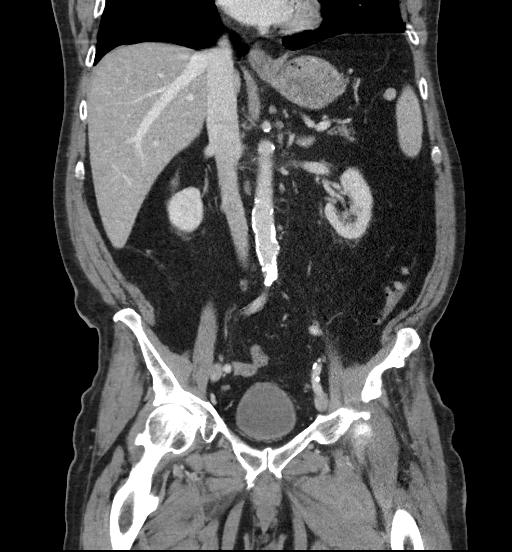

[16 of 46 positions shown; findings below may reference images not displayed]

FINDINGS: Lower chest: The lung bases are clear.

Hepatobiliary: Mild diffuse fatty infiltration of the liver. Focal
lesion in segment 7 measuring 2 cm diameter. There is peripheral
nodular enhancement early, consistent with a benign cavernous
hemangioma. Gallbladder and bile ducts are unremarkable.

Pancreas: Unremarkable. No pancreatic ductal dilatation or
surrounding inflammatory changes.

Spleen: Normal in size without focal abnormality.

Adrenals/Urinary Tract: Adrenal glands are unremarkable. Kidneys are
normal, without renal calculi, focal lesion, or hydronephrosis.
Bladder is unremarkable.

Stomach/Bowel: The stomach, small bowel, and colon are not
abnormally distended. Scattered colonic diverticula without evidence
of diverticulitis. The appendix is dilated, with appendiceal
diameter measuring 1.5 cm. Periappendiceal stranding and edema
consistent with acute appendicitis.

Appendix: Location: Right lower quadrant retrocecal.

Diameter: 15 mm

Appendicolith: No

Mucosal hyper-enhancement: Yes

Extraluminal gas: No

Periappendiceal collection: No

Vascular/Lymphatic: Aortic atherosclerosis. No enlarged abdominal or
pelvic lymph nodes.

Reproductive: Prostate gland is enlarged, measuring 5.2 cm diameter.

Other: No free air or free fluid in the abdomen. Abdominal wall
musculature appears intact. Hazy mesenteric stranding likely
representing inflammatory change such as mesenteritis.

Musculoskeletal: Mild degenerative changes in the spine and hips.
IMPRESSION: 1. Acute appendicitis. No abscess.
2. Mild diffuse fatty infiltration of the liver.
3. 2 cm diameter benign cavernous hemangioma in segment 7 of the
liver.
4. Enlarged prostate gland.
5. Aortic atherosclerosis.
6. Hazy mesenteric stranding likely representing inflammatory change
such as mesenteritis.

Aortic Atherosclerosis ([id number]).

## 2023-01-08 IMAGING — RF DG HIP (WITH OR WITHOUT PELVIS) 1V*R*
1 series · 12 of 12 positions shown · non-contrast
Comparison: None.

CLINICAL DATA: 79-year-old male status post hip replacement.

EXAM:
DG HIP (WITH OR WITHOUT PELVIS) 1V RIGHT

[Series 1: unknown protocol · 0.20mm/px · 12 of 12 slices shown]
[im 1/12]
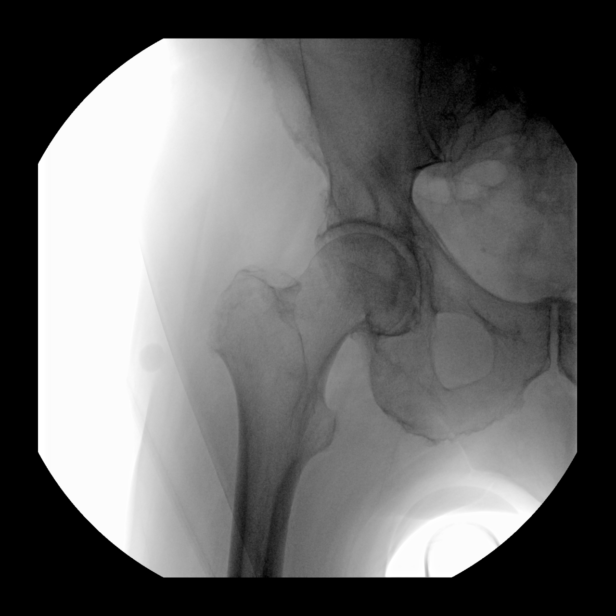
[im 2/12]
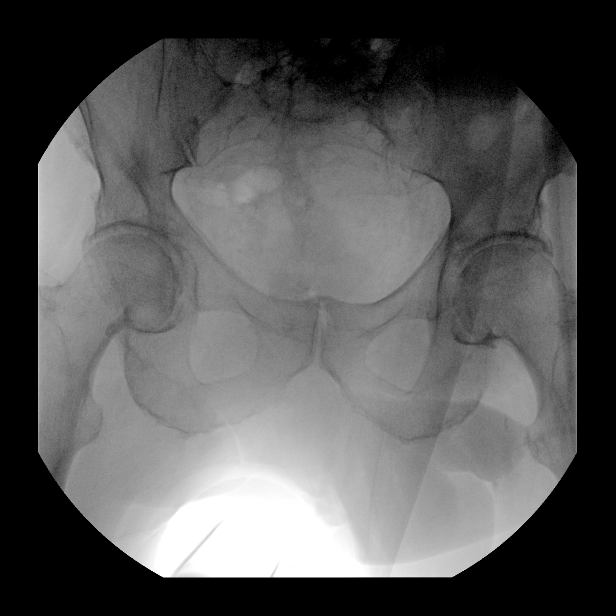
[im 3/12]
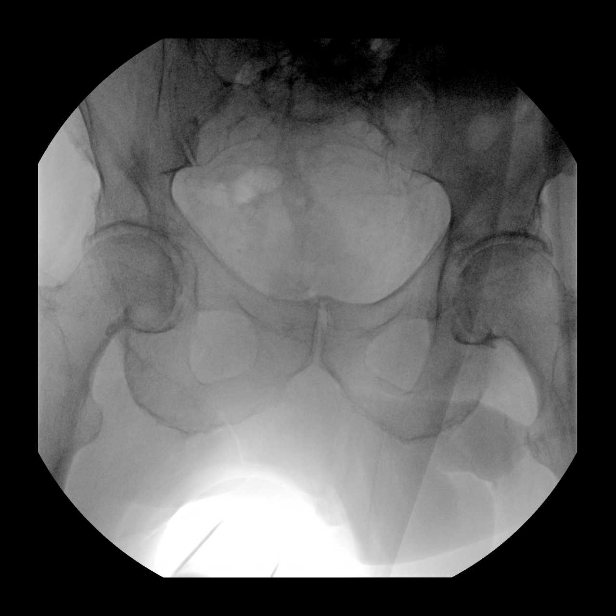
[im 4/12]
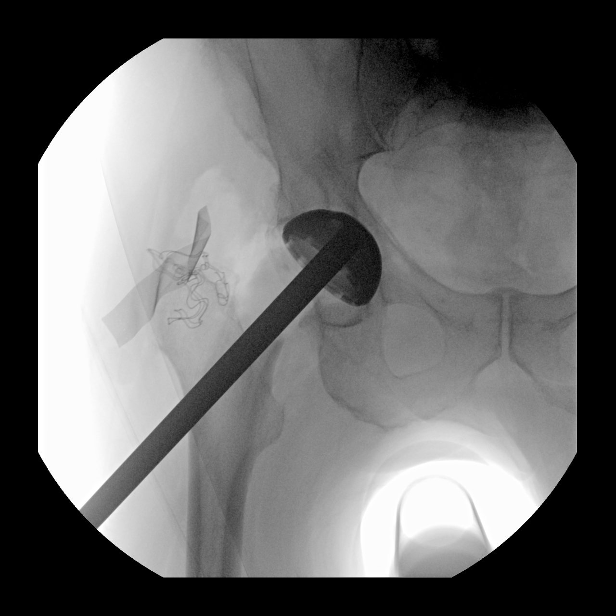
[im 5/12]
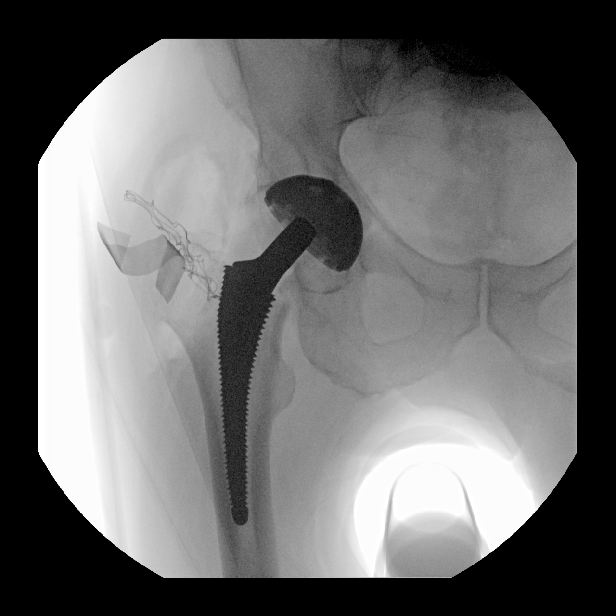
[im 6/12]
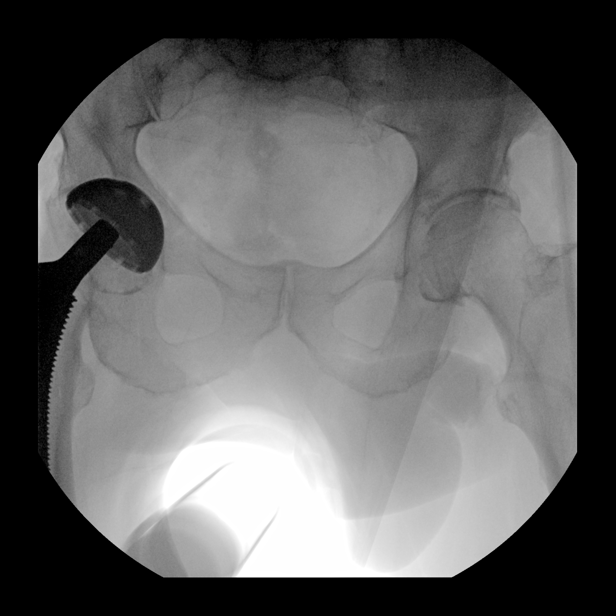
[im 7/12]
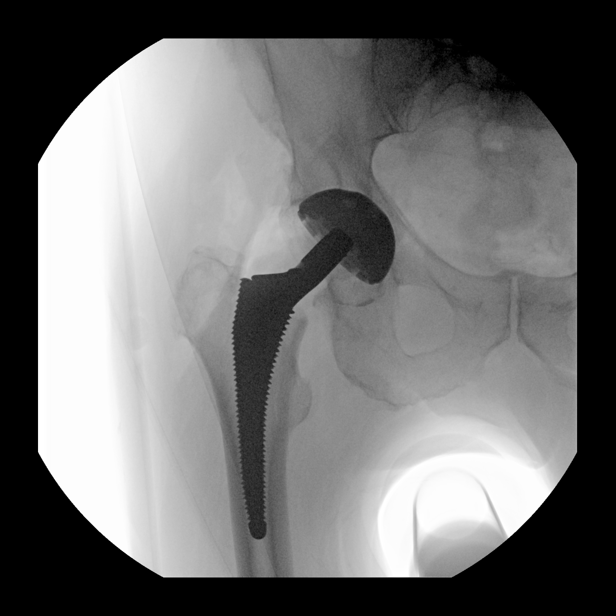
[im 8/12]
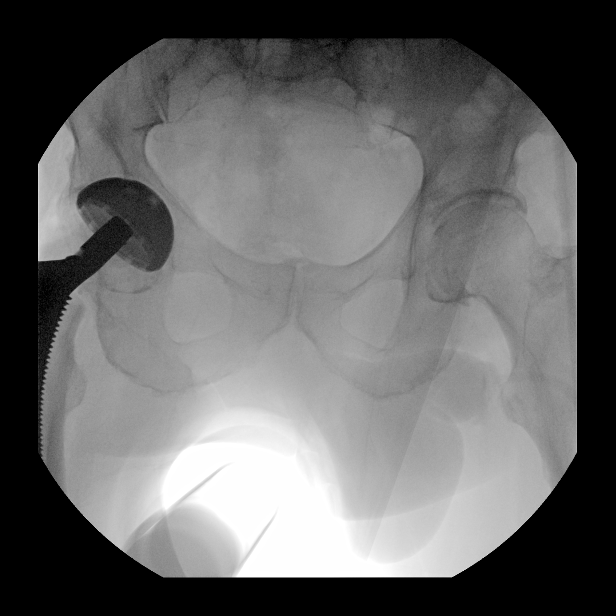
[im 9/12]
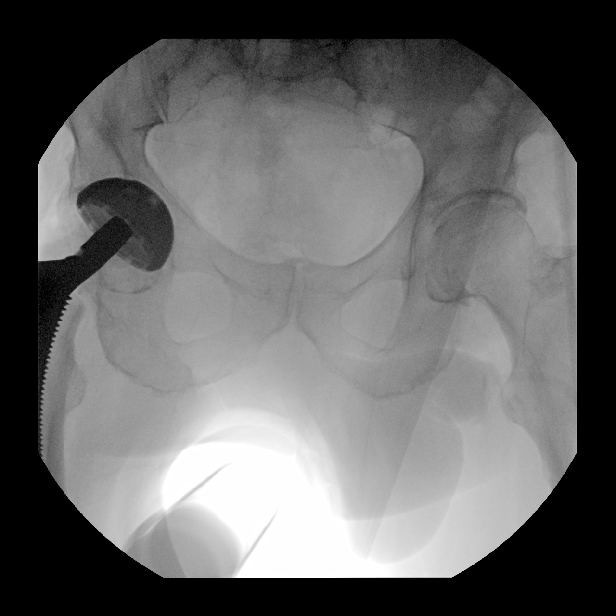
[im 10/12]
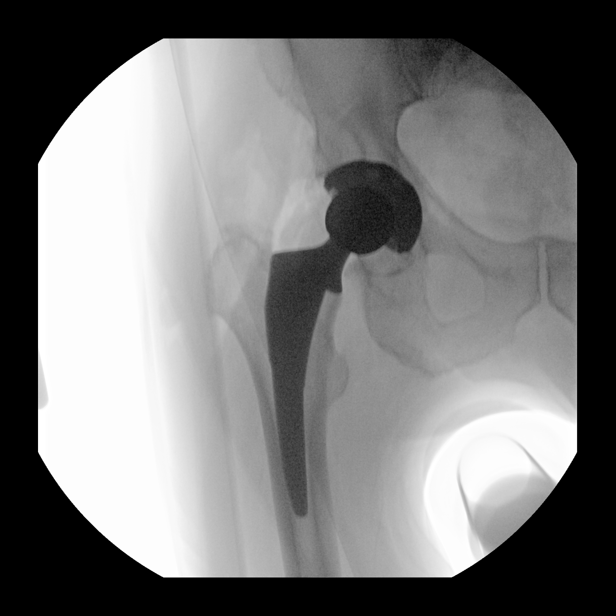
[im 11/12]
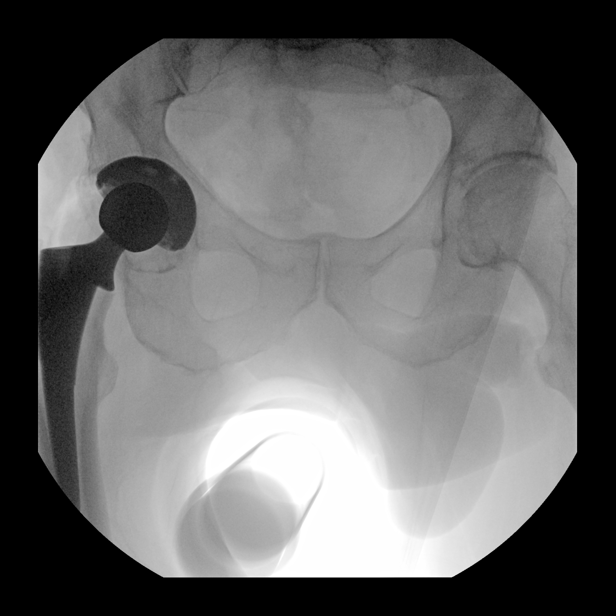
[im 12/12]
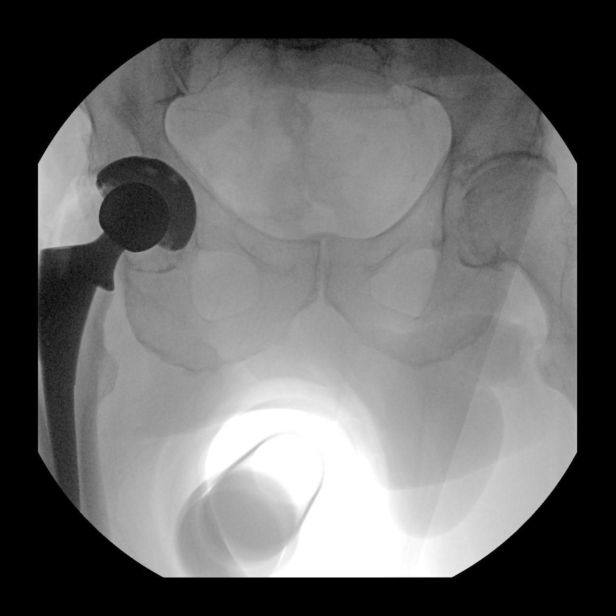

[12 of 12 positions shown; findings below may reference images not displayed]

FINDINGS: Twelve intraoperative fluoroscopic AP spot views of the right hip
and lower pelvis. These images demonstrate a right side bipolar hip
arthroplasty placement. No adverse features identified.

FLUOROSCOPY TIME:  0 minutes 8 seconds
IMPRESSION: Right side bipolar hip arthroplasty with no adverse features.

## 2024-04-15 LAB — LAB REPORT - SCANNED
A1c: 5.2
EGFR: 70

## 2024-06-04 ENCOUNTER — Ambulatory Visit: Admitting: Family Medicine

## 2024-06-04 VITALS — BP 122/81 | HR 80 | Temp 97.7°F | Ht 67.0 in | Wt 169.0 lb

## 2024-06-04 DIAGNOSIS — D72819 Decreased white blood cell count, unspecified: Secondary | ICD-10-CM | POA: Insufficient documentation

## 2024-06-04 DIAGNOSIS — E782 Mixed hyperlipidemia: Secondary | ICD-10-CM

## 2024-06-04 DIAGNOSIS — H6121 Impacted cerumen, right ear: Secondary | ICD-10-CM | POA: Diagnosis not present

## 2024-06-04 DIAGNOSIS — Z973 Presence of spectacles and contact lenses: Secondary | ICD-10-CM

## 2024-06-04 DIAGNOSIS — H9190 Unspecified hearing loss, unspecified ear: Secondary | ICD-10-CM

## 2024-06-04 DIAGNOSIS — J329 Chronic sinusitis, unspecified: Secondary | ICD-10-CM | POA: Insufficient documentation

## 2024-06-04 NOTE — Progress Notes (Signed)
 New Patient Office Visit  Patient ID: Arthur Moreno, Male   DOB: 1940-12-08 83 y.o. MRN: [id number]  Chief Complaint  Patient presents with   Establish Care    NOTE: Last CPE was on 04/15/2024 at [facility]   Ear Fullness    Wants to make sure his ears are clear because he has an upcoming appt to get new hearing aids.   Subjective:     Arthur Moreno presents to establish care  Ear Fullness     Discussed the use of AI scribe software for clinical note transcription with the patient, who gave verbal consent to proceed.  History of Present Illness   Arthur Moreno is an 83 year old male who presents to establish care.    Patient goes to the [facility] in [place] for PCP care and reports that they will continue to be his main PCP. He is establishing with our office to be seen for acute visits.   Hypertension - Blood pressure readings consistently below 130/80 mmHg - Not currently taking antihypertensive medication - Patient brought BP log today and values and consistently below 130/80. - Patient states that he previously was on BP medication, but it was stopped due to BP running low on it.   Hyperlipidemia and statin intolerance - Muscle pain with various statins, particularly in the hips - Difficulty with ambulation due to statin-induced myalgias - Discontinued statin therapy due to poor tolerance - Patient does not want to take anymore cholesterol medications.   Hearing loss - Difficulty understanding certain tones, especially women's voices over the phone - In process of obtaining new hearing aids from the [facility] - Awaiting new hearing aids, expected by December 18th - Patient requesting his ears be cleaned out today.   Chronic low WBC - Last WBC 1.7 on 04/15/24.  - Patient states that he has seen a specialist for this about 2 years ago and they just wanted to continue to monitor it. Patient reports that his PCP is aware of the low WBC. The note on the last lab work from [facility]  states that the low WBC has been stable. [facility] work [facility] and to be [facility] into our system.       Outpatient Encounter Medications as of 06/04/2024  Medication Sig   acetaminophen  (TYLENOL ) 500 MG tablet Take 1,000 mg by mouth every 6 (six) hours as needed.   Cholecalciferol (VITAMIN D3 PO) Take 1 capsule by mouth daily.   cyanocobalamin (VITAMIN B12) 1000 MCG/ML injection 1 ml Injection q week x 4 then every other week for 2 months; Duration: 90 days   ipratropium (ATROVENT) 0.06 % nasal spray USE 2 SPRAYS IN EACH NOSTRIL THREE TIMES DAILY AS DIRECTED (Patient taking differently: as needed.)   Omeprazole (PRILOSEC PO) Take 20 mg by mouth daily.   Turmeric (QC TUMERIC COMPLEX PO) Take 1,000 mg by mouth 2 (two) times daily.   [DISCONTINUED] amLODipine  (NORVASC ) 5 MG tablet Take 5 mg by mouth daily.   [DISCONTINUED] aspirin  EC 325 MG EC tablet Take 1 tablet (325 mg total) by mouth 2 (two) times daily. Then take one 81 mg aspirin  once a day for three weeks. Then discontinue aspirin .   [DISCONTINUED] lisinopril  (ZESTRIL ) 40 MG tablet Take 40 mg by mouth daily.   [DISCONTINUED] loratadine (CLARITIN) 10 MG tablet Take 10 mg by mouth daily.   [DISCONTINUED] methocarbamol  (ROBAXIN ) 500 MG tablet Take 1 tablet (500 mg total) by mouth every 6 (six) hours as needed for muscle spasms.   [  DISCONTINUED] omeprazole (PRILOSEC) 20 MG capsule 1 capsule 1/2 to 1 hour before morning meal Orally Once a day; Duration: 30 day(s)   [DISCONTINUED] VITAMIN B1-B12 IM Inject into the muscle See admin instructions. 1 injections IM every 2 months.   No facility-administered encounter medications on file as of 06/04/2024.    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  Social History   Socioeconomic History   Marital status: Married    Spouse name: Not on file   Number of children: Not on file   Years of education: Not on file   Highest education level: Not on file  Occupational History   Not on file  Tobacco Use   Smoking status: Former    Current packs/day: 0.00    Types: Cigarettes    Quit date: 06/07/2011    Years since quitting: 13.0   Smokeless tobacco: Never  Vaping Use   Vaping status: Never Used  Substance and Sexual Activity   Alcohol use: Yes    Comment: occas.   Drug use: Never   Sexual activity: Not on file  Other Topics Concern   Not on file  Social History Narrative   Not on file   Social Drivers of Health   Financial Resource Strain: Not on file  Food Insecurity: Not on file  Transportation Needs: Not on file  Physical Activity: Not on file  Stress: Not on file  Social Connections: Not on file  Intimate Partner Violence: Not on file    ROS    Objective:    BP 122/81   Pulse 80   Temp 97.7 F (36.5 C)   Ht 5' 7 (1.702 m)   Wt 169 lb (76.7 kg)   SpO2 98%   BMI 26.47 kg/m   Physical Exam Vitals reviewed.  Constitutional:      Appearance: Normal appearance.  HENT:     Head: Normocephalic and atraumatic.     Right Ear: Tympanic membrane, ear canal and external ear normal.     Left Ear: Tympanic membrane, ear canal and external ear normal.     Ears:     Comments: Cerumen impaction removed from R canal via curette and flushing. Patient tolerated well.  Eyes:     Extraocular Movements: Extraocular movements intact.     Conjunctiva/sclera: Conjunctivae normal.     Pupils: Pupils are equal, round, and  reactive to light.  Cardiovascular:     Rate and Rhythm: Normal rate and regular rhythm.     Pulses: Normal pulses.     Heart sounds: Normal heart sounds. No murmur heard. Pulmonary:     Effort: Pulmonary effort is normal. No respiratory distress.     Breath sounds: Normal breath sounds.  Musculoskeletal:        General: No deformity. Normal range of motion.     Cervical back: Normal range of motion.  Skin:  General: Skin is warm and dry.  Neurological:     General: No focal deficit present.     Mental Status: He is alert and oriented to person, place, and time.  Psychiatric:        Mood and Affect: Mood normal.        Behavior: Behavior normal.          Assessment & Plan:   Problem List Items Addressed This Visit       Other   Hyperlipidemia   Other Visit Diagnoses       Leukopenia, unspecified type    -  Primary     Impacted cerumen of right ear         Hard of hearing         Wears glasses           Assessment and Plan  Patient being seen today to establish care. He is currently a patient at the [facility] in [place] and reports that they will continue to be his PCP. Patient wants to establish care with us  for acute visits as needed.   Blood pressure - Normotensive. Not currently taking any medications.   Hyperlipidemia - Patient reports muscle weakness and pain with multiple statins. He is not interested in restarting cholesterol meds. We discussed that there are non statin medications available to trial. [facility] to [facility].   Hearing loss Managed with hearing aids. Awaiting new aids for improved effectiveness. - Await new hearing aids scheduled for December 18th. - R ear cleaned today via curette and flushing.   Chronic low WBC - Continue to f/u with PCP and specialist at [facility].        Return in about 6 months (around 12/02/2024).   Oneil LELON Severin, FNP [facility]
